# Patient Record
Sex: Female | Born: 1960 | Race: White | Hispanic: No | Marital: Married | Smoking: Current every day smoker
Health system: Southern US, Community
[De-identification: ages and names within clinical notes are randomized; demographics above are authoritative.]

## PROBLEM LIST (undated history)

## (undated) ENCOUNTER — Encounter

## (undated) ENCOUNTER — Encounter: Attending: Infectious Disease | Primary: Infectious Disease

## (undated) ENCOUNTER — Encounter: Attending: Psychiatry | Primary: Psychiatry

## (undated) ENCOUNTER — Ambulatory Visit: Payer: PRIVATE HEALTH INSURANCE

## (undated) ENCOUNTER — Ambulatory Visit: Payer: BLUE CROSS/BLUE SHIELD | Attending: Psychiatry | Primary: Psychiatry

## (undated) ENCOUNTER — Ambulatory Visit: Payer: PRIVATE HEALTH INSURANCE | Attending: Infectious Disease | Primary: Infectious Disease

## (undated) ENCOUNTER — Ambulatory Visit

## (undated) ENCOUNTER — Encounter: Attending: Family | Primary: Family

## (undated) ENCOUNTER — Telehealth

## (undated) ENCOUNTER — Ambulatory Visit: Payer: BLUE CROSS/BLUE SHIELD | Attending: Infectious Disease | Primary: Infectious Disease

## (undated) ENCOUNTER — Other Ambulatory Visit

## (undated) ENCOUNTER — Inpatient Hospital Stay

## (undated) ENCOUNTER — Ambulatory Visit: Payer: BLUE CROSS/BLUE SHIELD

## (undated) ENCOUNTER — Ambulatory Visit: Payer: PRIVATE HEALTH INSURANCE | Attending: Psychiatry | Primary: Psychiatry

## (undated) ENCOUNTER — Encounter: Payer: PRIVATE HEALTH INSURANCE | Attending: Infectious Disease | Primary: Infectious Disease

## (undated) ENCOUNTER — Encounter: Payer: BLUE CROSS/BLUE SHIELD | Attending: Psychiatry | Primary: Psychiatry

## (undated) ENCOUNTER — Telehealth: Attending: Infectious Disease | Primary: Infectious Disease

## (undated) ENCOUNTER — Encounter: Payer: 59 | Attending: Infectious Disease | Primary: Infectious Disease

## (undated) ENCOUNTER — Ambulatory Visit
Payer: PRIVATE HEALTH INSURANCE | Attending: Student in an Organized Health Care Education/Training Program | Primary: Student in an Organized Health Care Education/Training Program

## (undated) ENCOUNTER — Encounter: Attending: Clinical | Primary: Clinical

## (undated) ENCOUNTER — Encounter: Attending: Orthopaedic Surgery | Primary: Orthopaedic Surgery

## (undated) ENCOUNTER — Ambulatory Visit
Payer: Medicaid (Managed Care) | Attending: Student in an Organized Health Care Education/Training Program | Primary: Student in an Organized Health Care Education/Training Program

## (undated) ENCOUNTER — Encounter: Attending: Medical | Primary: Medical

## (undated) ENCOUNTER — Ambulatory Visit: Attending: Infectious Disease | Primary: Infectious Disease

## (undated) ENCOUNTER — Encounter
Attending: Student in an Organized Health Care Education/Training Program | Primary: Student in an Organized Health Care Education/Training Program

## (undated) DIAGNOSIS — E7800 Pure hypercholesterolemia, unspecified: Secondary | ICD-10-CM

## (undated) DIAGNOSIS — I10 Essential (primary) hypertension: Secondary | ICD-10-CM

## (undated) HISTORY — PX: TOTAL SHOULDER ARTHROPLASTY: SHX126

## (undated) HISTORY — PX: ABDOMINAL HYSTERECTOMY: SHX81

## (undated) HISTORY — PX: BREAST LUMPECTOMY: SHX2

---

## 2009-07-11 ENCOUNTER — Emergency Department: Payer: Self-pay | Admitting: Emergency Medicine

## 2010-08-06 IMAGING — CR DG CHEST 2V
1 series · 2 of 2 positions shown · non-contrast
Comparison: none

REASON FOR EXAM: chest pain
COMMENTS:

[Series 1: view not recorded · 0.17mm/px · 2 of 2 slices shown]
[im 1/2]
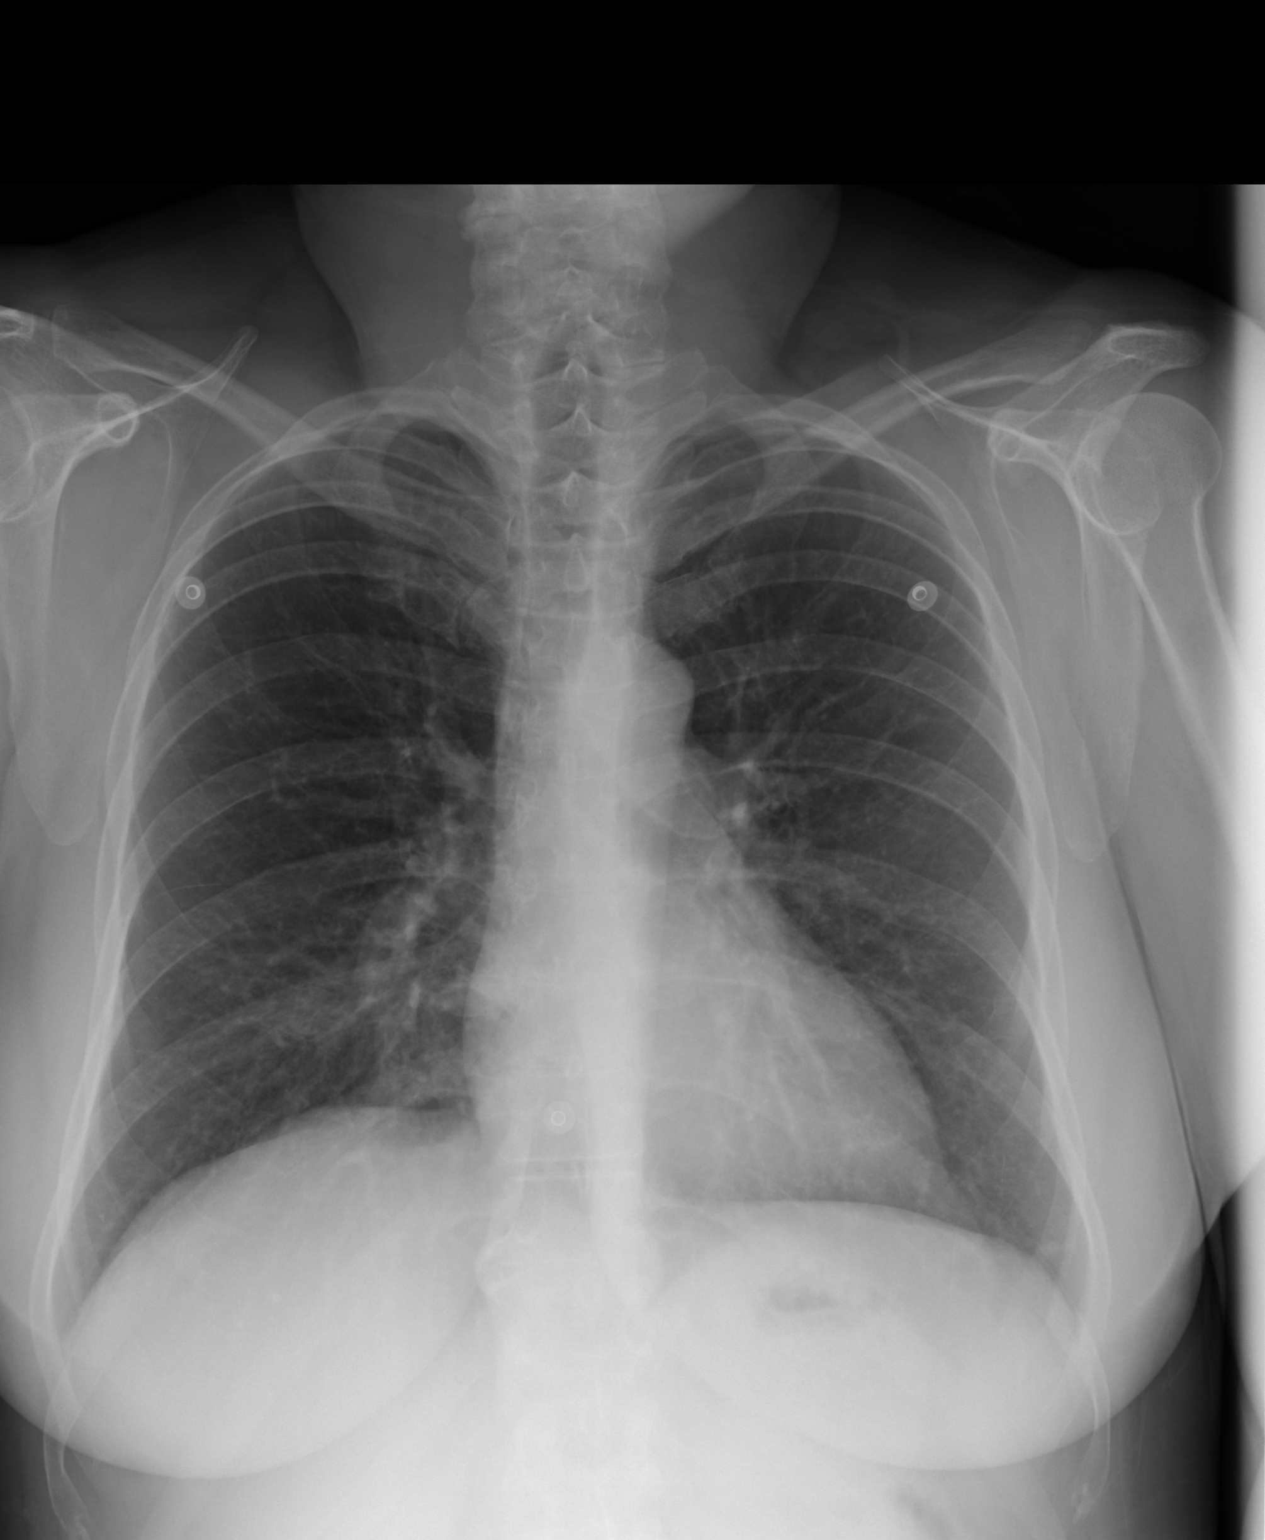
[im 2/2]
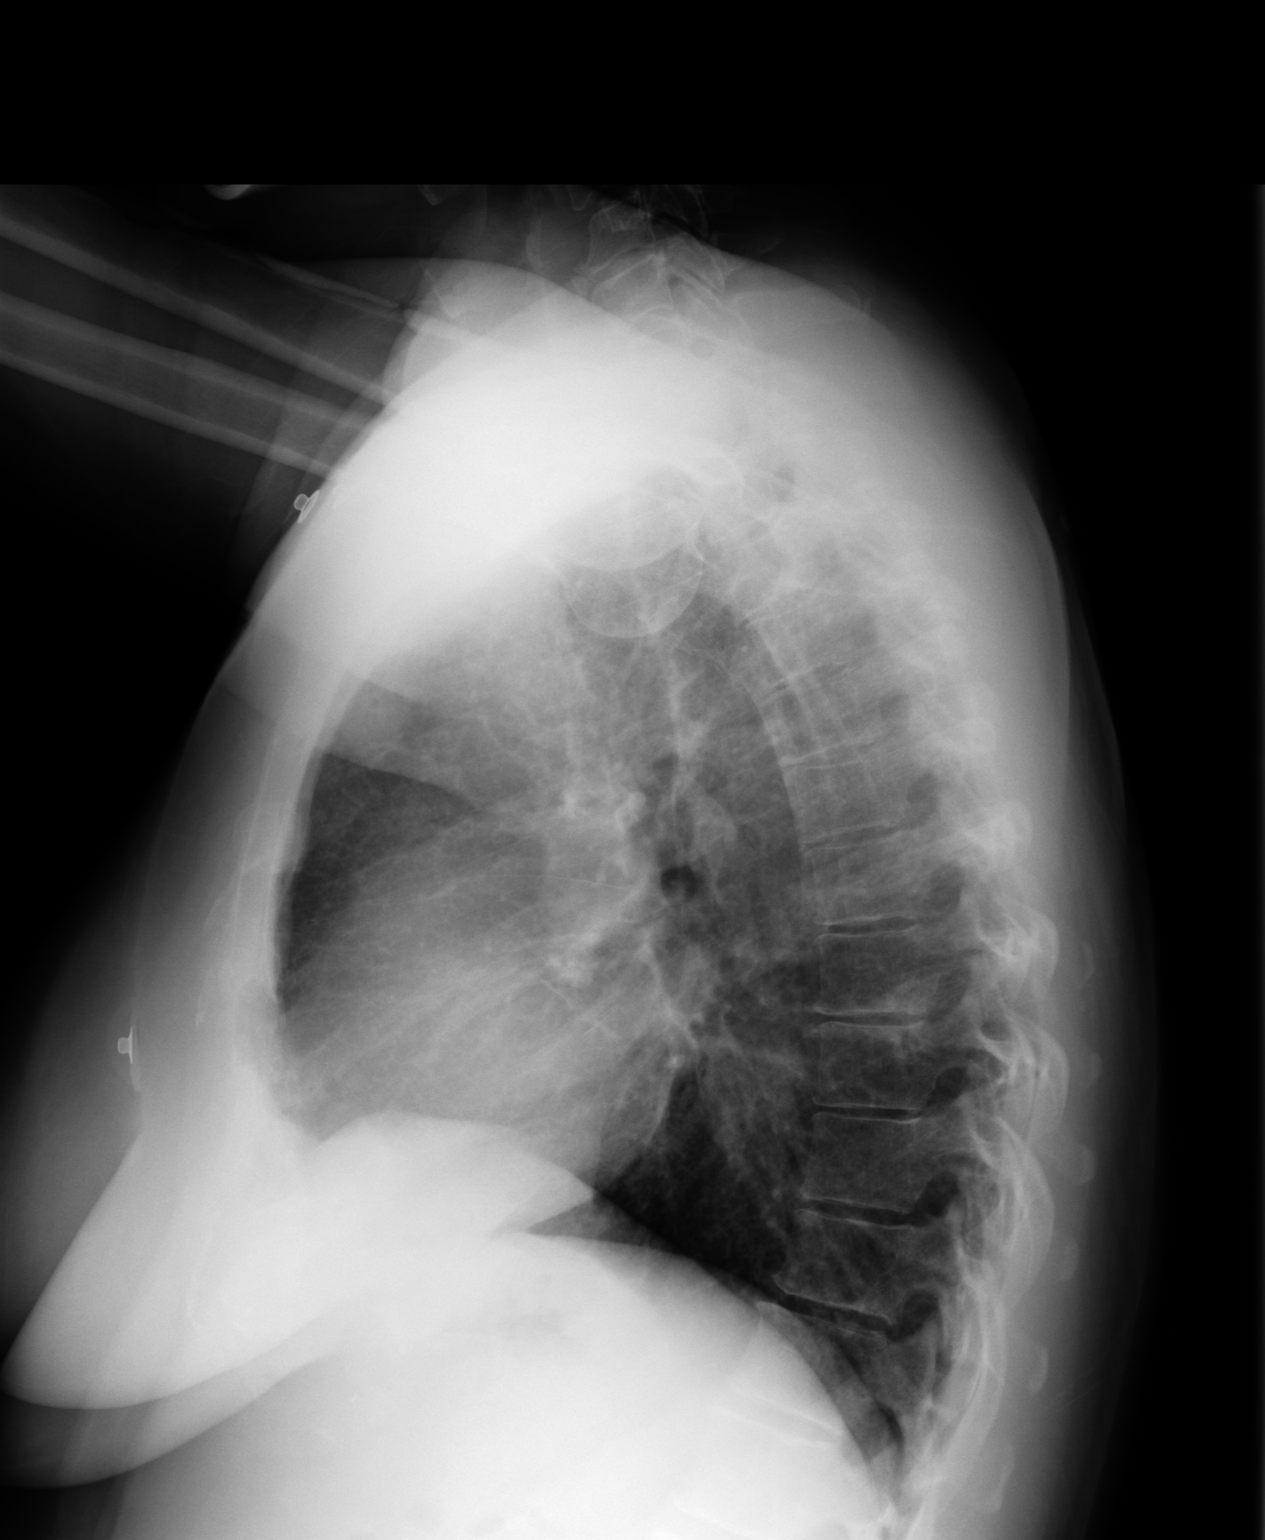

[2 of 2 positions shown; findings below may reference images not displayed]

PROCEDURE:     DXR - DXR CHEST PA (OR AP) AND LATERAL  - July 11, 2009 [DATE]

RESULT:     PA and lateral views of the chest show the lung fields to be
clear. No pneumonia, pneumothorax or pleural effusion is seen. Heart size is
normal. The mediastinal and osseous structures reveal no significant
abnormalities.
IMPRESSION: 1.     No acute changes are identified.

## 2017-06-02 MED FILL — TIVICAY/50MG/TAB: TIVICAY/50MG/TAB | 30 days supply | Qty: 30 | Fill #5

## 2017-06-02 MED FILL — DESCOVY/200MG/25MG/TABS: DESCOVY/200MG/25MG/TABS | 30 days supply | Qty: 30 | Fill #5

## 2017-06-06 ENCOUNTER — Ambulatory Visit
Admission: RE | Admit: 2017-06-06 | Discharge: 2017-06-06 | Disposition: A | Discharge status: Home / Self Care | Payer: Commercial Managed Care - PPO | Attending: Infectious Disease | Admitting: Infectious Disease

## 2017-06-06 DIAGNOSIS — B20 Human immunodeficiency virus [HIV] disease: Principal | ICD-10-CM

## 2017-06-06 MED ORDER — EMTRICITABINE 200 MG-RILPIVIRINE 25 MG-TENOFOVIR ALAFENAM 25 MG TABLET
ORAL_TABLET | Freq: Every day | ORAL | 11 refills | 0 days | Status: CP
Start: 2017-06-06 — End: 2018-07-03

## 2017-06-06 MED ORDER — ENALAPRIL MALEATE 20 MG TABLET
ORAL_TABLET | Freq: Every day | ORAL | 3 refills | 0 days | Status: CP
Start: 2017-06-06 — End: 2018-02-09

## 2017-06-06 MED ORDER — ALPRAZOLAM 0.5 MG TABLET
ORAL_TABLET | Freq: Every evening | ORAL | 3 refills | 0.00000 days | Status: CP | PRN
Start: 2017-06-06 — End: 2018-01-24

## 2017-06-07 LAB — LYMPH MARKER LIMITED,FLOW
ABSOLUTE CD3 CNT: 2557 {cells}/uL (ref 915–3400)
ABSOLUTE CD4 CNT: 1674 {cells}/uL (ref 510–2320)
ABSOLUTE CD8 CNT: 883 {cells}/uL (ref 180–1520)
CD3% (T CELLS)": 84 % (ref 61–86)
CD8% T SUPPRESR": 29 % (ref 12–38)

## 2017-06-07 LAB — HIV RNA: HIV 1 RNA:NCnc:Pt:Ser/Plas:Qn:Probe.amp.tar: 0

## 2017-06-07 LAB — HIV RNA, QUANTITATIVE, PCR: HIV RNA QNT RSLT: NOT DETECTED

## 2017-06-07 LAB — CD4% (T HELPER)": Cells.CD3+CD4+/100 cells:NFr:Pt:Bld:Qn:: 55

## 2017-06-14 MED FILL — ODEFSEY/200-25-25MG/TABS: ODEFSEY/200-25-25MG/TABS | 30 days supply | Qty: 30 | Fill #0

## 2017-06-14 NOTE — Unmapped (Signed)
[facility]   Patient Onboarding/Medication Counseling    Ms.Meder is a 56 y.o. female with HIV who I am counseling today on initiation of therapy.    Medication: Charlett Lango    Verified patient's date of birth / HIPAA.      Education Provided: ??    Dose/Administration discussed: Yes. This medication should be taken  with food.     Storage requirements: this medicine should be stored at room temperature.     Side effects discussed: Discussed common side effects, including headache, sleepiness, abnormal dreams, belly pain, nausea, upset stomach, dizziness,  If patient experiences signs of lactic acidosis (see LexiComp), low mood/depression, irregular/rapid heart rate, signs of kidney problems (see Lexicomp), they need to call the doctor.  Patient will receive a Lexi-Comp drug information handout with shipment.    Handling precautions reviewed:  n/a.    Drug Interactions: other medications reviewed and up to date in Epic.  No drug interactions identified.    Comorbidities/Allergies: reviewed and up to date in Epic.    Verified therapy is appropriate and should continue      Delivery Information    Anticipated copay of $0.00 reviewed with patient. Verified delivery address in FSI and reviewed medication storage requirement.    Scheduled delivery date: 06/16/17    Explained that we ship using UPS and this shipment will not require a signature.      Explained the services we provide at [facility] and that each month we would call to set up refills.  Stressed importance of returning phone calls so that we could ensure they receive their medications in time each month.  Informed patient that we should be setting up refills 7-10 days prior to when they will run out of medication.  Informed patient that welcome packet will be sent.      Patient verbalized understanding of the above information as well as how to contact the pharmacy at [phone] option 4 with any questions/concerns.        Patient Specific Needs      ? Patient has no physical or cognitive barriers.    ? Patient prefers to have medications discussed with  Patient     ? Patient is able to read and understand education materials at a high school level or above.        Roderic Palau  [facility] Specialty Pharmacist

## 2017-07-07 NOTE — Unmapped (Signed)
[facility] Refill and Clinical Coordination Note: HIV     HIV Medication(s): ZHYQMVH  Additional Medication(s): none    Patricia Meyer, DOB: August 14, 1961  Phone: [phone] (home) , Alternate phone contact: N/A  Shipping address: [address]   [place] [address]  Phone or address changes today?: No  All above HIPAA information verified.  Insurance changes? No    Completed refill and clinical call assessment today to schedule patient's medication shipment from the [facility] [phone]).      MEDICATION RECONCILIATION    Confirmed the medication and dosage are correct and have not changed: Yes, regimen is correct and unchanged.    Were there any changes to your medication(s) in the past month:  No, there are no changes reported at this time.    HIV-related labs:  Lab Results   Component Value Date/Time    HIVRS Not Detected 06/06/2017 12:06 PM    HIVRS Not Detected 01/05/2016 12:57 PM    HIVRS Not Detected 03/10/2015 04:43 PM    HIVRS Detected 07/01/2014 03:32 PM    HIVRS Not Detected 06/25/2013 04:48 PM    HIVRS Not Detected 09/18/2012 04:41 PM    HIVCP <40 07/01/2014 03:32 PM    HIV10 <1.60 07/01/2014 03:32 PM    HIVCM  06/06/2017 12:06 PM      Comment:      HIV-1 quantification by real-time RT-PCR is performed using the Abbott RealTime  HIV-1 test. This test is FDA approved and can quantitate HIV-1 RNA over the  range of 40 - 1,000,000 copies/mL (1.60 log(10) -6.00 log(10) copies/mL). The reference range for this assay is Not Detected.    HIVCM  01/05/2016 12:57 PM      Comment:      HIV-1 quantification by real-time RT-PCR is performed using the Abbott RealTime  HIV-1 test. This test is FDA approved and can quantitate HIV-1 RNA over the  range of 40 - 1,000,000 copies/mL (1.60 log(10) -6.00 log(10) copies/mL). The reference range for this assay is Not Detected.    HIVCM  03/10/2015 04:43 PM      Comment:      HIV-1 quantification by real-time RT-PCR is performed using the Abbott RealTime  HIV-1 test. This test is FDA approved and can quantitate HIV-1 RNA over the  range of 40 - 1,000,000 copies/mL (1.60 log(10) -6.00 log(10) copies/mL).    HIVCM : 07/01/2014 03:32 PM    HIVCM : 06/25/2013 04:48 PM    HIVCM : 09/18/2012 04:41 PM    ACD4 1,674 06/06/2017 12:06 PM    ACD4 2,250 01/05/2016 12:57 PM    ACD4  03/10/2015 04:43 PM      Comment:      2450 cells/ul    ACD4 2,404 (H) 06/25/2013 04:48 PM    ACD4 2,208 01/10/2012 03:34 PM    ACD4 1,536 09/27/2011 01:31 PM         ADHERENCE      Number of tablets of HIV Medication(s) at home: 10    Did you miss any doses in the past 4 weeks? No missed doses reported.  Adherence counseling provided? Not needed     SIDE EFFECT MANAGEMENT    Are you tolerating your medication?:  Elizbeth reports tolerating the medication.  Side effect management discussed: None      Therapy is appropriate and should be continued.        FINANCIAL/SHIPPING    Delivery Scheduled: Yes, Expected  medication delivery date: 07/12/17   Additional medications refilled: No additional medications/refills needed at this time.    [place] did not have any additional questions at this time.    Delivery address validated in FSI scheduling system: Yes, address listed above is correct.         We will follow up with patient monthly for standard refill processing and delivery.      Thank you,  Roderic Palau   [facility] Specialty Pharmacist

## 2017-07-11 MED FILL — ODEFSEY/200-25-25MG/TABS: ODEFSEY/200-25-25MG/TABS | 30 days supply | Qty: 30 | Fill #1

## 2017-07-14 NOTE — Unmapped (Signed)
PROMIS Tablet Screening  Completed Date: 06/06/2017    SW reviewed self-administered screening.  Pt scored a 2 on PHQ-9 endorsing no depression.  Denies SI. Denies SA or ETOH use. Pt scored 5 on AUDIT/AUDIT-C indicating At risk alcohol consumption. Pt reports no concerns for IPV.  Pt seen for regular ID visit today.          Arlyn Dunning, MSW, LCSW  [facility]

## 2017-08-08 NOTE — Unmapped (Signed)
[facility] Refill Coordination Note  Specialty Medication(s): ODEFSEY      Bonni Neuser, DOB: [birth year]  Phone: [phone] (home) , Alternate phone contact: N/A  Phone or address changes today?: No  All above HIPAA information was verified with patient.  Shipping Address: [address]   [place] [address]   Insurance changes? No    Completed refill call assessment today to schedule patient's medication shipment from the [facility] [phone]).      Confirmed the medication and dosage are correct and have not changed: Yes, regimen is correct and unchanged.    Confirmed patient started or stopped the following medications in the past month:  No, there are no changes reported at this time.    Are you tolerating your medication?:  Hildy reports tolerating the medication.    ADHERENCE    (Below is required for Medicare Part B or Transplant patients only - per drug):   How many tablets were dispensed last month: 30  Patient currently has ENOUGH THROUGH 08/12/17 remaining.    Did you miss any doses in the past 4 weeks? No missed doses reported.    FINANCIAL/SHIPPING    Delivery Scheduled: Yes, Expected medication delivery date: 08/11/17     Samanta did not have any additional questions at this time.    Delivery address validated in FSI scheduling system: Yes, address listed in FSI is correct.    We will follow up with patient monthly for standard refill processing and delivery.      Thank you,  Westley Gambles   [facility] Specialty Technician

## 2017-08-09 MED FILL — ODEFSEY/200-25-25MG/TABS: ODEFSEY/200-25-25MG/TABS | 30 days supply | Qty: 30 | Fill #2

## 2017-09-05 MED FILL — ODEFSEY/200-25-25MG/TABS: ODEFSEY/200-25-25MG/TABS | 30 days supply | Qty: 30 | Fill #3

## 2017-09-05 NOTE — Unmapped (Signed)
[facility] Refill Coordination Note  Specialty Medication(s): ODEFSEY      Patricia Meyer, DOB: 29-Jun-1961  Phone: [phone] (home) , Alternate phone contact: N/A  Phone or address changes today?: No  All above HIPAA information was verified with patient.  Shipping Address: [address]   [place] [address]   Insurance changes? No    Completed refill call assessment today to schedule patient's medication shipment from the [facility] [phone]).      Confirmed the medication and dosage are correct and have not changed: Yes, regimen is correct and unchanged.    Confirmed patient started or stopped the following medications in the past month:  No, there are no changes reported at this time.    Are you tolerating your medication?:  Patricia Meyer reports tolerating the medication.    ADHERENCE        Did you miss any doses in the past 4 weeks? No missed doses reported.    FINANCIAL/SHIPPING    Delivery Scheduled: Yes, Expected medication delivery date: 09/07/17     Patricia Meyer did not have any additional questions at this time.    Delivery address validated in FSI scheduling system: Yes, address listed in FSI is correct.    We will follow up with patient monthly for standard refill processing and delivery.      Thank you,  Westley Gambles   [facility] Specialty Technician

## 2017-10-03 NOTE — Unmapped (Signed)
[facility] Refill Coordination Note  Medication: ODEFSEY    Unable to reach patient to schedule shipment for medication being filled at [facility]. Left voicemail on phone.  As this is the 3rd unsuccessful attempt to reach the patient, no additional phone call attempts will be made at this time.      Phone numbers attempted: [phone]  [phone]  Last scheduled delivery: SHIPPED 09/05/17    Please call the [facility] at [phone] (option 4) should you have any further questions.      Thanks,  [facility] Specialty Team

## 2017-10-03 NOTE — Unmapped (Signed)
Called pt at number located in epic due to as of today [place] shared services has not been able to reach pt to place a refill on their medication. Rcvd pts vm. Left message w/ [place] shared services direct contact info.     Avel Peace

## 2017-10-05 NOTE — Unmapped (Signed)
[facility] Refill Coordination Note  Specialty Medication(s): ODEFSEY      Patricia Meyer, DOB: 02/15/1961  Phone: [phone] (home) , Alternate phone contact: N/A  Phone or address changes today?: No  All above HIPAA information was verified with patient.  Shipping Address: [address]   [place] [address]   Insurance changes? No    Completed refill call assessment today to schedule patient's medication shipment from the [facility] [phone]).      Confirmed the medication and dosage are correct and have not changed: Yes, regimen is correct and unchanged.    Confirmed patient started or stopped the following medications in the past month:  No, there are no changes reported at this time.    Are you tolerating your medication?:  Patricia Meyer reports tolerating the medication.    ADHERENCE    (Below is required for Medicare Part B or Transplant patients only - per drug):   How many tablets were dispensed last month: 30  Patient currently has 3 remaining.    Did you miss any doses in the past 4 weeks? No missed doses reported.    FINANCIAL/SHIPPING    Delivery Scheduled: Yes, Expected medication delivery date: 10/07/17     Patricia Meyer did not have any additional questions at this time.    Delivery address validated in FSI scheduling system: Yes, address listed in FSI is correct.    We will follow up with patient monthly for standard refill processing and delivery.      Thank you,  Westley Gambles   [facility] Specialty Technician

## 2017-10-06 MED FILL — ODEFSEY/200-25-25MG/TABS: ODEFSEY/200-25-25MG/TABS | 30 days supply | Qty: 30 | Fill #4

## 2017-10-27 NOTE — Unmapped (Signed)
[facility] Pharm[facility] Refill Coordination Note  Specialty Medication(s): ODEFSEY      Patricia Meyer, DOB: 1961/01/29  Phone: [phone] (home) , Alternate phone contact: N/A  Phone or address changes today?: No  All above HIPAA information was verified with patient.  Shipping Address: [address]   [place] [address]   Insurance changes? No    Completed refill call assessment today to schedule patient's medication shipment from the [facility] [phone]).      Confirmed the medication and dosage are correct and have not changed: Yes, regimen is correct and unchanged.    Confirmed patient started or stopped the following medications in the past month:  No, there are no changes reported at this time.    Are you tolerating your medication?:  Patricia Meyer reports tolerating the medication.    ADHERENCE        Did you miss any doses in the past 4 weeks? No missed doses reported.    FINANCIAL/SHIPPING    Delivery Scheduled: Yes, Expected medication delivery date: 11/02/17     Patricia Meyer did not have any additional questions at this time.    Delivery address validated in FSI scheduling system: Yes, address listed in FSI is correct.    We will follow up with patient monthly for standard refill processing and delivery.      Thank you,  Westley Gambles   [facility] Specialty Technician

## 2017-10-31 MED FILL — ODEFSEY/200-25-25MG/TABS: ODEFSEY/200-25-25MG/TABS | 30 days supply | Qty: 30 | Fill #5

## 2017-11-29 MED FILL — ODEFSEY/200-25-25MG/TABS: ODEFSEY/200-25-25MG/TABS | 30 days supply | Qty: 30 | Fill #6

## 2017-11-30 NOTE — Unmapped (Signed)
[facility] Pharm[facility] Refill Coordination Note  Specialty Medication(s): ODEFSEY      Vonzella Althaus, DOB: 03/25/1961  Phone: [phone] (home) , Alternate phone contact: N/A  Phone or address changes today?: No  All above HIPAA information was verified with patient.  Shipping Address: [address]   [place] [address]   Insurance changes? No    Completed refill call assessment today to schedule patient's medication shipment from the [facility] [phone]).      Confirmed the medication and dosage are correct and have not changed: Yes, regimen is correct and unchanged.    Confirmed patient started or stopped the following medications in the past month:  No, there are no changes reported at this time.    Are you tolerating your medication?:  Lasharn reports tolerating the medication.    ADHERENCE        Did you miss any doses in the past 4 weeks? No missed doses reported.    FINANCIAL/SHIPPING    Delivery Scheduled: Yes, Expected medication delivery date: 12/02/17     Karlen did not have any additional questions at this time.    Delivery address validated in FSI scheduling system: Yes, address listed in FSI is correct.    We will follow up with patient monthly for standard refill processing and delivery.      Thank you,  Westley Gambles   [facility] Shared [facility] Specialty Technician

## 2017-12-23 NOTE — Unmapped (Signed)
[facility] Pharm[facility] Refill and Clinical Coordination Note  Medication(s): Odefsey 200-25-2mg     Topaz Raglin, DOB: May 23, 1961  Phone: [phone] (home) , Alternate phone contact: N/A  Shipping address: [address]   [place] [address]  Phone or address changes today?: No  All above HIPAA information verified.  Insurance changes? No    Completed refill and clinical call assessment today to schedule patient's medication shipment from the [facility] [phone]).      MEDICATION RECONCILIATION    Confirmed the medication and dosage are correct and have not changed: Yes, regimen is correct and unchanged.    Were there any changes to your medication(s) in the past month:  Yes. Kaneisha reports starting the following medications: Gabapentin use intermittently.    ADHERENCE    Is this medicine transplant or covered by Medicare Part B? No.    Did you miss any doses in the past 4 weeks? No missed doses reported.  Adherence counseling provided? Not needed     SIDE EFFECT MANAGEMENT    Are you tolerating your medication?:  Patricia Meyer reports tolerating the medication.  Side effect management discussed: None      Therapy is appropriate and should be continued.    Evidence of clinical benefit: See Epic note from 06/06/17.      FINANCIAL/SHIPPING    Delivery Scheduled: Yes, Expected medication delivery date: 12/29/2017   Additional medications refilled: No additional medications/refills needed at this time.    Hanan did not have any additional questions at this time.    Delivery address validated in FSI scheduling system: Yes, address listed above is correct.      We will follow up with patient monthly for standard refill processing and delivery.      Thank you,  Keleigh Kazee  Anders Grant   [facility] Specialty Pharmacist

## 2017-12-27 MED FILL — ODEFSEY/200-25-25MG/TABS: ODEFSEY/200-25-25MG/TABS | 30 days supply | Qty: 30 | Fill #7

## 2018-01-25 MED ORDER — ALPRAZOLAM 0.5 MG TABLET
ORAL_TABLET | 5 refills | 0 days | Status: CP
Start: 2018-01-25 — End: 2018-08-14

## 2018-01-26 MED FILL — ODEFSEY/200-25-25MG/TABS: ODEFSEY/200-25-25MG/TABS | 30 days supply | Qty: 30 | Fill #8

## 2018-01-26 NOTE — Unmapped (Signed)
[facility] Pharm[facility] Refill Coordination Note  Specialty Medication(s): ZOXWRUE  Additional Medications shipped: none    Patricia Meyer, DOB: September 20, 1961  Phone: [phone] (home) , Alternate phone contact: N/A  Phone or address changes today?: No  All above HIPAA information was verified with patient.  Shipping Address: [address]   [place] [address]   Insurance changes? No    Completed refill call assessment today to schedule patient's medication shipment from the [facility] [phone]).      Confirmed the medication and dosage are correct and have not changed: Yes, regimen is correct and unchanged.    Confirmed patient started or stopped the following medications in the past month:  No, there are no changes reported at this time.    Are you tolerating your medication?:  Patricia Meyer reports tolerating the medication.    ADHERENCE      Did you miss any doses in the past 4 weeks? No missed doses reported.    FINANCIAL/SHIPPING    Delivery Scheduled: Yes, Expected medication delivery date: 01/27/18     The patient will receive an FSI print out for each medication shipped and additional FDA Medication Guides as required.  Patient education from [place] or Robet Leu may also be included in the shipment    Patricia Meyer did not have any additional questions at this time.    Delivery address validated in FSI scheduling system: Yes, address listed in FSI is correct.    We will follow up with patient monthly for standard refill processing and delivery.      Thank you,  Rollen Sox   [facility] Shared [facility] Specialty Pharmacist

## 2018-02-06 ENCOUNTER — Ambulatory Visit: Admit: 2018-02-06 | Discharge: 2018-02-07 | Payer: PRIVATE HEALTH INSURANCE

## 2018-02-06 DIAGNOSIS — E785 Hyperlipidemia, unspecified: Secondary | ICD-10-CM

## 2018-02-06 DIAGNOSIS — B20 Human immunodeficiency virus [HIV] disease: Principal | ICD-10-CM

## 2018-02-06 LAB — COMPREHENSIVE METABOLIC PANEL
ALBUMIN: 4.7 g/dL (ref 3.5–5.0)
ALKALINE PHOSPHATASE: 71 U/L (ref 38–126)
ALT (SGPT): 32 U/L (ref 15–48)
ANION GAP: 10 mmol/L (ref 9–15)
AST (SGOT): 32 U/L (ref 14–38)
BILIRUBIN TOTAL: 0.7 mg/dL (ref 0.0–1.2)
BLOOD UREA NITROGEN: 15 mg/dL (ref 7–21)
BUN / CREAT RATIO: 17
CALCIUM: 10 mg/dL (ref 8.5–10.2)
CHLORIDE: 102 mmol/L (ref 98–107)
CO2: 27 mmol/L (ref 22.0–30.0)
CREATININE: 0.86 mg/dL (ref 0.60–1.00)
EGFR MDRD NON AF AMER: >=60 mL/min/{1.73_m2} (ref >=60)
GLUCOSE RANDOM: 96 mg/dL (ref 65–179)
POTASSIUM: 4.2 mmol/L (ref 3.5–5.0)
PROTEIN TOTAL: 7.1 g/dL (ref 6.5–8.3)
SODIUM: 139 mmol/L (ref 135–145)

## 2018-02-06 LAB — CBC W/ AUTO DIFF
BASOPHILS ABSOLUTE COUNT: 0.1 10*9/L (ref 0.0–0.1)
BASOPHILS RELATIVE PERCENT: 0.8 %
EOSINOPHILS ABSOLUTE COUNT: 0.2 10*9/L (ref 0.0–0.4)
EOSINOPHILS RELATIVE PERCENT: 2 %
HEMATOCRIT: 50.9 % — ABNORMAL HIGH (ref 36.0–46.0)
HEMOGLOBIN: 16.9 g/dL — ABNORMAL HIGH (ref 12.0–16.0)
LYMPHOCYTES ABSOLUTE COUNT: 3.4 10*9/L (ref 1.5–5.0)
LYMPHOCYTES RELATIVE PERCENT: 40.9 %
MEAN CORPUSCULAR HEMOGLOBIN CONC: 33.1 g/dL (ref 31.0–37.0)
MEAN CORPUSCULAR HEMOGLOBIN: 34.4 pg — ABNORMAL HIGH (ref 26.0–34.0)
MEAN CORPUSCULAR VOLUME: 103.7 fL — ABNORMAL HIGH (ref 80.0–100.0)
MEAN PLATELET VOLUME: 8.3 fL (ref 7.0–10.0)
MONOCYTES ABSOLUTE COUNT: 0.3 10*9/L (ref 0.2–0.8)
MONOCYTES RELATIVE PERCENT: 3.9 %
NEUTROPHILS ABSOLUTE COUNT: 4.2 10*9/L (ref 2.0–7.5)
NEUTROPHILS RELATIVE PERCENT: 50.7 %
PLATELET COUNT: 369 10*9/L (ref 150–440)
RED BLOOD CELL COUNT: 4.9 10*12/L (ref 4.00–5.20)
RED CELL DISTRIBUTION WIDTH: 12.9 % (ref 12.0–15.0)

## 2018-02-06 LAB — LIPID PANEL
CHOLESTEROL/HDL RATIO SCREEN: 3.2 (ref <5.0)
HDL CHOLESTEROL: 75 mg/dL — ABNORMAL HIGH (ref 40–59)
LDL CHOLESTEROL CALCULATED: 134 mg/dL — ABNORMAL HIGH (ref 60–99)
TRIGLYCERIDES: 154 mg/dL — ABNORMAL HIGH (ref 1–149)
VLDL CHOLESTEROL CAL: 30.8 mg/dL (ref 11–40)

## 2018-02-06 LAB — LDL CHOLESTEROL CALCULATED: Cholesterol.in LDL:MCnc:Pt:Ser/Plas:Qn:Calculated: 134 — ABNORMAL HIGH

## 2018-02-06 LAB — LYMPH MARKER LIMITED,FLOW
ABSOLUTE CD3 CNT: 2888 {cells}/uL (ref 915–3400)
ABSOLUTE CD4 CNT: 1640 {cells}/uL (ref 510–2320)
ABSOLUTE CD8 CNT: 1212 {cells}/uL (ref 180–1520)
CD4% (T HELPER)": 46 % (ref 34–58)
CD4:CD8 RATIO: 1.4 (ref 0.9–4.8)
CD8% T SUPPRESR": 34 % (ref 12–38)

## 2018-02-06 LAB — CD3% (T CELLS)": Lab: 81

## 2018-02-06 LAB — NEUTROPHILS RELATIVE PERCENT: Lab: 50.7

## 2018-02-06 LAB — EGFR MDRD AF AMER: Glomerular filtration rate/1.73 sq M.predicted.black:ArVRat:Pt:Ser/Plas/Bld:Qn:Creatinine-based formula (MDRD): >=60

## 2018-02-07 LAB — HIV RNA, QUANTITATIVE, PCR

## 2018-02-07 LAB — HIV RNA: HIV 1 RNA:NCnc:Pt:Ser/Plas:Qn:Probe.amp.tar: 0

## 2018-02-09 MED ORDER — ENALAPRIL MALEATE 20 MG TABLET
ORAL_TABLET | 6 refills | 0 days | Status: CP
Start: 2018-02-09 — End: 2019-01-05

## 2018-02-23 NOTE — Unmapped (Signed)
[facility] Pharmacy Refill Coordination Note  Specialty Medication(s): ODEFSEY  Additional Medications shipped: NONE    Patricia Meyer, DOB: [birth year]  Phone: [phone] (home) , Alternate phone contact: N/A  Phone or address changes today?: No  All above HIPAA information was verified with patient.  Shipping Address: [address]   [place] [address]   Insurance changes? No    Completed refill call assessment today to schedule patient's medication shipment from the [facility] [phone]).      Confirmed the medication and dosage are correct and have not changed: Yes, regimen is correct and unchanged.    Confirmed patient started or stopped the following medications in the past month:  No, there are no changes reported at this time.    Are you tolerating your medication?:  Patricia Meyer reports tolerating the medication.    ADHERENCE    Did you miss any doses in the past 4 weeks? No missed doses reported.    FINANCIAL/SHIPPING    Delivery Scheduled: Yes, Expected medication delivery date: 02/28/18     The patient will receive an FSI print out for each medication shipped and additional FDA Medication Guides as required.  Patient education from [place] or Robet Leu may also be included in the shipment    Courtney did not have any additional questions at this time.    Delivery address validated in FSI scheduling system: Yes, address listed in FSI is correct.    We will follow up with patient monthly for standard refill processing and delivery.      Thank you,  Thad Ranger   [facility] Shared [facility] Specialty Pharmacist

## 2018-02-27 MED FILL — ODEFSEY/200-25-25MG/TABS: ODEFSEY/200-25-25MG/TABS | 30 days supply | Qty: 30 | Fill #9

## 2018-03-27 NOTE — Unmapped (Addendum)
5/6 further update: Patient called back with new ins info, KFR added ins to our system, it states patient must fill at [facility] specialty [phone]). Patient was given this info and she will call [facility] specialty today. I will also high priority message physician office to get new rx sent there since patient only has 4-5 days left of medication. Dis-enrolling patient from our specialty services at this time, but she is aware to call us with any problems/concerns in the future. -EF    5/6 Update: Patient said that she tried to get a rx filled at outside pharmacy and it said her coverage ended May 1st. When I ran a test claim, that is what it told me as well. She has already called customer service number on ins card and they said to speak with her employer. She is going to do that now, and will call us back by Wed with new billing info.    [facility] Special[facility] Pharmacy Refill Coordination Note  Specialty Medication(s): ODEFSY  Additional Medications shipped: NONE    Patricia Meyer, DOB: 1961/06/29  Phone: [phone] (home) , Alternate phone contact: N/A  Phone or address changes today?: No  All above HIPAA information was verified with patient.  Shipping Address: [address]   [place] [address]   Insurance changes? No    Completed refill call assessment today to schedule patient's medication shipment from the [facility] [phone]).      Confirmed the medication and dosage are correct and have not changed: Yes, regimen is correct and unchanged.    Confirmed patient started or stopped the following medications in the past month:  No, there are no changes reported at this time.    Are you tolerating your medication?:  Patricia Meyer reports tolerating the medication.    ADHERENCE    Did you miss any doses in the past 4 weeks? No missed doses reported.    FINANCIAL/SHIPPING    Delivery Scheduled: Yes, Expected medication delivery date: 03/30/18     The patient will receive an FSI print out for each medication shipped and additional FDA Medication Guides as required.  Patient education from [place] or Robet Leu may also be included in the shipment    Patricia Meyer did not have any additional questions at this time.    Delivery address validated in FSI scheduling system: Yes, address listed in FSI is correct.    We will follow up with patient monthly for standard refill processing and delivery.      Thank you,  Thad Ranger   [facility] Shared [facility] Specialty Pharmacist

## 2018-07-05 MED ORDER — EMTRICITABINE 200 MG-RILPIVIRINE 25 MG-TENOFOVIR ALAFENAM 25 MG TABLET
ORAL_TABLET | Freq: Every day | ORAL | 11 refills | 0 days | Status: CP
Start: 2018-07-05 — End: 2018-08-31

## 2018-08-16 MED ORDER — ALPRAZOLAM 0.5 MG TABLET
ORAL_TABLET | 5 refills | 0 days | Status: CP
Start: 2018-08-16 — End: 2019-02-14

## 2018-08-31 MED ORDER — EMTRICITABINE 200 MG-RILPIVIRINE 25 MG-TENOFOVIR ALAFENAM 25 MG TABLET
ORAL_TABLET | Freq: Every day | ORAL | 11 refills | 0 days | Status: CP
Start: 2018-08-31 — End: ?

## 2018-09-18 ENCOUNTER — Ambulatory Visit: Admit: 2018-09-18 | Discharge: 2018-09-19

## 2018-09-18 ENCOUNTER — Ambulatory Visit: Admit: 2018-09-18 | Discharge: 2018-09-19 | Attending: Infectious Disease | Primary: Infectious Disease

## 2018-09-18 DIAGNOSIS — B20 Human immunodeficiency virus [HIV] disease: Principal | ICD-10-CM

## 2019-01-05 MED ORDER — ENALAPRIL MALEATE 20 MG TABLET
ORAL_TABLET | 6 refills | 0 days | Status: CP
Start: 2019-01-05 — End: ?

## 2019-02-14 MED ORDER — ALPRAZOLAM 0.5 MG TABLET
ORAL_TABLET | Freq: Every evening | ORAL | 5 refills | 0 days | Status: CP | PRN
Start: 2019-02-14 — End: 2019-02-26

## 2019-02-20 ENCOUNTER — Ambulatory Visit: Admit: 2019-02-20 | Discharge: 2019-02-21 | Payer: PRIVATE HEALTH INSURANCE

## 2019-02-20 DIAGNOSIS — I10 Essential (primary) hypertension: Principal | ICD-10-CM

## 2019-02-20 DIAGNOSIS — R05 Cough: Principal | ICD-10-CM

## 2019-02-20 DIAGNOSIS — Z9289 Personal history of other medical treatment: Principal | ICD-10-CM

## 2019-02-20 DIAGNOSIS — R509 Fever, unspecified: Principal | ICD-10-CM

## 2019-02-20 DIAGNOSIS — R0989 Other specified symptoms and signs involving the circulatory and respiratory systems: Principal | ICD-10-CM

## 2019-02-20 DIAGNOSIS — R51 Headache: Principal | ICD-10-CM

## 2019-02-20 DIAGNOSIS — B20 Human immunodeficiency virus [HIV] disease: Principal | ICD-10-CM

## 2019-02-20 DIAGNOSIS — R0602 Shortness of breath: Principal | ICD-10-CM

## 2019-02-20 DIAGNOSIS — Z1383 Encounter for screening for respiratory disorder NEC: Principal | ICD-10-CM

## 2019-02-21 ENCOUNTER — Institutional Professional Consult (permissible substitution): Admit: 2019-02-21 | Discharge: 2019-02-22 | Payer: PRIVATE HEALTH INSURANCE

## 2019-02-21 DIAGNOSIS — R5383 Other fatigue: Secondary | ICD-10-CM

## 2019-02-21 DIAGNOSIS — J069 Acute upper respiratory infection, unspecified: Principal | ICD-10-CM

## 2019-02-21 DIAGNOSIS — R05 Cough: Secondary | ICD-10-CM

## 2019-02-21 DIAGNOSIS — R0602 Shortness of breath: Secondary | ICD-10-CM

## 2019-02-21 MED ORDER — CEFDINIR 300 MG CAPSULE
ORAL_CAPSULE | Freq: Two times a day (BID) | ORAL | 0 refills | 0.00000 days | Status: CP
Start: 2019-02-21 — End: 2019-03-03

## 2019-02-21 MED ORDER — BENZONATATE 200 MG CAPSULE
ORAL_CAPSULE | Freq: Three times a day (TID) | ORAL | 0 refills | 0.00000 days | Status: CP | PRN
Start: 2019-02-21 — End: 2019-03-03

## 2019-02-21 MED ORDER — METHYLPREDNISOLONE 4 MG TABLETS IN A DOSE PACK
PACK | Freq: Every day | ORAL | 0 refills | 0 days | Status: CP
Start: 2019-02-21 — End: 2020-02-21

## 2019-02-26 ENCOUNTER — Institutional Professional Consult (permissible substitution)
Admit: 2019-02-26 | Discharge: 2019-02-27 | Payer: PRIVATE HEALTH INSURANCE | Attending: Infectious Disease | Primary: Infectious Disease

## 2019-02-26 DIAGNOSIS — B20 Human immunodeficiency virus [HIV] disease: Principal | ICD-10-CM

## 2019-02-26 MED ORDER — ALPRAZOLAM 0.5 MG TABLET
ORAL_TABLET | 0 refills | 0 days | Status: CP
Start: 2019-02-26 — End: ?

## 2019-08-20 MED ORDER — ALPRAZOLAM 0.5 MG TABLET
ORAL_TABLET | 5 refills | 0 days | Status: CP
Start: 2019-08-20 — End: ?

## 2019-10-23 DIAGNOSIS — B20 Human immunodeficiency virus [HIV] disease: Principal | ICD-10-CM

## 2019-10-23 MED ORDER — EMTRICITABINE 200 MG-RILPIVIRINE 25 MG-TENOFOVIR ALAFENAM 25 MG TABLET
ORAL_TABLET | Freq: Every day | ORAL | 11 refills | 30 days | Status: CP
Start: 2019-10-23 — End: ?

## 2019-10-24 DIAGNOSIS — B20 Human immunodeficiency virus [HIV] disease: Principal | ICD-10-CM

## 2019-10-24 MED ORDER — EMTRICITABINE 200 MG-RILPIVIRINE 25 MG-TENOFOVIR ALAFENAM 25 MG TABLET
ORAL_TABLET | Freq: Every day | ORAL | 11 refills | 0 days | Status: CP
Start: 2019-10-24 — End: ?

## 2019-11-01 ENCOUNTER — Encounter: Admit: 2019-11-01 | Discharge: 2019-11-02 | Payer: PRIVATE HEALTH INSURANCE

## 2019-11-01 DIAGNOSIS — B20 Human immunodeficiency virus [HIV] disease: Principal | ICD-10-CM

## 2019-11-14 DIAGNOSIS — B20 Human immunodeficiency virus [HIV] disease: Principal | ICD-10-CM

## 2019-12-11 MED ORDER — ENALAPRIL MALEATE 20 MG TABLET
ORAL_TABLET | 6 refills | 0 days | Status: CP
Start: 2019-12-11 — End: ?

## 2020-02-22 MED ORDER — ALPRAZOLAM 0.5 MG TABLET
ORAL_TABLET | 0 refills | 0 days | Status: CP
Start: 2020-02-22 — End: ?

## 2020-04-23 MED ORDER — ALPRAZOLAM 0.5 MG TABLET
ORAL_TABLET | 0 refills | 0 days | Status: CP
Start: 2020-04-23 — End: ?

## 2020-06-09 ENCOUNTER — Ambulatory Visit: Admit: 2020-06-09 | Discharge: 2020-06-09 | Payer: PRIVATE HEALTH INSURANCE

## 2020-06-09 ENCOUNTER — Ambulatory Visit
Admit: 2020-06-09 | Discharge: 2020-06-09 | Payer: PRIVATE HEALTH INSURANCE | Attending: Infectious Disease | Primary: Infectious Disease

## 2020-06-09 DIAGNOSIS — F419 Anxiety disorder, unspecified: Principal | ICD-10-CM

## 2020-06-09 DIAGNOSIS — B20 Human immunodeficiency virus [HIV] disease: Principal | ICD-10-CM

## 2020-06-09 MED ORDER — BENAZEPRIL 20 MG-HYDROCHLOROTHIAZIDE 12.5 MG TABLET
ORAL_TABLET | Freq: Every day | ORAL | 3 refills | 90.00000 days | Status: CP
Start: 2020-06-09 — End: 2021-06-09

## 2020-06-09 MED ORDER — ATORVASTATIN 20 MG TABLET
ORAL_TABLET | Freq: Every day | ORAL | 11 refills | 30.00000 days | Status: CP
Start: 2020-06-09 — End: 2020-07-09

## 2020-06-23 MED ORDER — ALPRAZOLAM 0.5 MG TABLET
ORAL_TABLET | 0 refills | 0.00000 days
Start: 2020-06-23 — End: ?

## 2020-06-25 MED ORDER — ALPRAZOLAM 0.5 MG TABLET
ORAL_TABLET | ORAL | 0 refills | 0.00000 days | Status: CP
Start: 2020-06-25 — End: ?

## 2020-06-30 DIAGNOSIS — F419 Anxiety disorder, unspecified: Principal | ICD-10-CM

## 2020-07-07 ENCOUNTER — Ambulatory Visit: Admit: 2020-07-07 | Discharge: 2020-07-08

## 2020-09-09 MED ORDER — ALPRAZOLAM 0.5 MG TABLET
ORAL_TABLET | 0 refills | 0 days
Start: 2020-09-09 — End: ?

## 2020-09-11 ENCOUNTER — Ambulatory Visit: Admit: 2020-09-11 | Discharge: 2020-09-12 | Attending: Psychiatry | Primary: Psychiatry

## 2020-09-11 DIAGNOSIS — F411 Generalized anxiety disorder: Principal | ICD-10-CM

## 2020-09-11 DIAGNOSIS — F410 Panic disorder [episodic paroxysmal anxiety] without agoraphobia: Secondary | ICD-10-CM

## 2020-09-11 MED ORDER — ALPRAZOLAM 0.5 MG TABLET
ORAL_TABLET | ORAL | 0 refills | 0.00000 days | Status: CP
Start: 2020-09-11 — End: 2020-09-11

## 2020-09-11 MED ORDER — ALPRAZOLAM 0.5 MG TABLET: tablet | 1 refills | 0 days | Status: AC

## 2020-10-26 ENCOUNTER — Ambulatory Visit
Admit: 2020-10-26 | Discharge: 2020-10-26 | Disposition: A | Discharge status: Home / Self Care | Payer: PRIVATE HEALTH INSURANCE | Attending: Student in an Organized Health Care Education/Training Program

## 2020-10-26 DIAGNOSIS — F17210 Nicotine dependence, cigarettes, uncomplicated: Principal | ICD-10-CM

## 2020-10-26 DIAGNOSIS — I10 Essential (primary) hypertension: Principal | ICD-10-CM

## 2020-10-26 DIAGNOSIS — R439 Unspecified disturbances of smell and taste: Principal | ICD-10-CM

## 2020-10-26 DIAGNOSIS — Z79899 Other long term (current) drug therapy: Principal | ICD-10-CM

## 2020-10-26 DIAGNOSIS — R519 Headache, unspecified: Principal | ICD-10-CM

## 2020-10-26 DIAGNOSIS — N12 Tubulo-interstitial nephritis, not specified as acute or chronic: Principal | ICD-10-CM

## 2020-10-26 DIAGNOSIS — Z888 Allergy status to other drugs, medicaments and biological substances status: Principal | ICD-10-CM

## 2020-10-26 DIAGNOSIS — Z20822 Contact with and (suspected) exposure to covid-19: Principal | ICD-10-CM

## 2020-10-26 DIAGNOSIS — J029 Acute pharyngitis, unspecified: Principal | ICD-10-CM

## 2020-10-27 MED ORDER — CIPROFLOXACIN 500 MG TABLET
ORAL_TABLET | Freq: Two times a day (BID) | ORAL | 0 refills | 7 days | Status: CP
Start: 2020-10-27 — End: 2020-11-03

## 2020-12-07 MED ORDER — ENALAPRIL MALEATE 20 MG TABLET
ORAL_TABLET | 6 refills | 0 days
Start: 2020-12-07 — End: ?

## 2020-12-18 DIAGNOSIS — B20 Human immunodeficiency virus [HIV] disease: Principal | ICD-10-CM

## 2020-12-18 MED ORDER — EMTRICITABINE 200 MG-RILPIVIRINE 25 MG-TENOFOVIR ALAFENAM 25 MG TABLET
ORAL_TABLET | Freq: Every day | ORAL | 6 refills | 90 days | Status: CP
Start: 2020-12-18 — End: ?

## 2021-03-19 ENCOUNTER — Telehealth: Admit: 2021-03-19 | Discharge: 2021-03-20 | Attending: Psychiatry | Primary: Psychiatry

## 2021-03-19 DIAGNOSIS — F410 Panic disorder [episodic paroxysmal anxiety] without agoraphobia: Principal | ICD-10-CM

## 2021-03-19 DIAGNOSIS — F411 Generalized anxiety disorder: Principal | ICD-10-CM

## 2021-03-23 MED ORDER — ALPRAZOLAM 0.5 MG TABLET
ORAL_TABLET | 1 refills | 0 days | Status: CP
Start: 2021-03-23 — End: ?

## 2021-04-01 DIAGNOSIS — B20 Human immunodeficiency virus [HIV] disease: Principal | ICD-10-CM

## 2021-04-01 MED ORDER — EMTRICITABINE 200 MG-RILPIVIRINE 25 MG-TENOFOVIR ALAFENAM 25 MG TABLET
ORAL_TABLET | Freq: Every day | ORAL | 0 refills | 90.00000 days | Status: CP
Start: 2021-04-01 — End: 2021-04-01

## 2021-04-24 MED ORDER — ALPRAZOLAM 0.5 MG TABLET
ORAL_TABLET | ORAL | 0 refills | 0.00000 days | Status: CP
Start: 2021-04-24 — End: ?

## 2021-05-04 ENCOUNTER — Ambulatory Visit: Admit: 2021-05-04 | Discharge: 2021-05-04 | Attending: Infectious Disease | Primary: Infectious Disease

## 2021-05-04 DIAGNOSIS — B20 Human immunodeficiency virus [HIV] disease: Principal | ICD-10-CM

## 2021-05-04 MED ORDER — BENAZEPRIL 20 MG-HYDROCHLOROTHIAZIDE 25 MG TABLET
ORAL_TABLET | Freq: Every day | ORAL | 3 refills | 90.00000 days | Status: CP
Start: 2021-05-04 — End: 2022-05-04

## 2021-05-04 MED ORDER — ATORVASTATIN 40 MG TABLET
ORAL_TABLET | Freq: Every day | ORAL | 3 refills | 90 days | Status: CP
Start: 2021-05-04 — End: 2022-05-04

## 2021-06-02 DIAGNOSIS — U071 COVID: Principal | ICD-10-CM

## 2021-06-02 MED ORDER — NIRMATRELVIR 300 MG (150 MG X 2)-RITONAVIR 100 MG TABLET (EUA)
ORAL_TABLET | 0 refills | 5.00000 days | Status: CP
Start: 2021-06-02 — End: 2021-06-07

## 2021-06-07 DIAGNOSIS — B20 Human immunodeficiency virus [HIV] disease: Principal | ICD-10-CM

## 2021-06-07 MED ORDER — ODEFSEY 200 MG-25 MG-25 MG TABLET
ORAL_TABLET | 3 refills | 0.00000 days
Start: 2021-06-07 — End: ?

## 2021-06-12 MED ORDER — ODEFSEY 200 MG-25 MG-25 MG TABLET
ORAL_TABLET | ORAL | 3 refills | 0.00000 days | Status: CP
Start: 2021-06-12 — End: ?

## 2021-06-25 ENCOUNTER — Telehealth: Admit: 2021-06-25 | Discharge: 2021-06-26 | Attending: Psychiatry | Primary: Psychiatry

## 2021-06-25 DIAGNOSIS — F410 Panic disorder [episodic paroxysmal anxiety] without agoraphobia: Principal | ICD-10-CM

## 2021-06-25 DIAGNOSIS — F411 Generalized anxiety disorder: Principal | ICD-10-CM

## 2021-06-25 MED ORDER — ALPRAZOLAM 0.5 MG TABLET
ORAL_TABLET | 2 refills | 0 days | Status: CP
Start: 2021-06-25 — End: ?

## 2021-07-09 DIAGNOSIS — F411 Generalized anxiety disorder: Principal | ICD-10-CM

## 2021-07-09 DIAGNOSIS — F410 Panic disorder [episodic paroxysmal anxiety] without agoraphobia: Principal | ICD-10-CM

## 2021-07-09 MED ORDER — ALPRAZOLAM 0.5 MG TABLET
ORAL_TABLET | 0 refills | 0 days | Status: CP
Start: 2021-07-09 — End: ?

## 2021-08-16 ENCOUNTER — Observation Stay
Admit: 2021-08-16 | Discharge: 2021-08-16 | Disposition: A | Discharge status: Home / Self Care | Payer: BC Managed Care – PPO | Attending: Internal Medicine | Admitting: Internal Medicine

## 2021-08-16 ENCOUNTER — Observation Stay
Admission: EM | Admit: 2021-08-16 | Discharge: 2021-08-16 | Disposition: A | Discharge status: Home / Self Care | Payer: BC Managed Care – PPO | Attending: Internal Medicine | Admitting: Internal Medicine

## 2021-08-16 ENCOUNTER — Emergency Department: Payer: BC Managed Care – PPO

## 2021-08-16 ENCOUNTER — Encounter: Payer: Self-pay | Admitting: *Deleted

## 2021-08-16 ENCOUNTER — Other Ambulatory Visit: Payer: Self-pay

## 2021-08-16 DIAGNOSIS — I2489 Other forms of acute ischemic heart disease: Secondary | ICD-10-CM

## 2021-08-16 DIAGNOSIS — F419 Anxiety disorder, unspecified: Secondary | ICD-10-CM

## 2021-08-16 DIAGNOSIS — Z6379 Other stressful life events affecting family and household: Secondary | ICD-10-CM

## 2021-08-16 DIAGNOSIS — R079 Chest pain, unspecified: Secondary | ICD-10-CM | POA: Diagnosis not present

## 2021-08-16 DIAGNOSIS — I4891 Unspecified atrial fibrillation: Principal | ICD-10-CM

## 2021-08-16 DIAGNOSIS — Z96619 Presence of unspecified artificial shoulder joint: Secondary | ICD-10-CM | POA: Insufficient documentation

## 2021-08-16 DIAGNOSIS — B20 Human immunodeficiency virus [HIV] disease: Secondary | ICD-10-CM | POA: Diagnosis not present

## 2021-08-16 DIAGNOSIS — E876 Hypokalemia: Secondary | ICD-10-CM | POA: Diagnosis not present

## 2021-08-16 DIAGNOSIS — I10 Essential (primary) hypertension: Secondary | ICD-10-CM

## 2021-08-16 DIAGNOSIS — Z20822 Contact with and (suspected) exposure to covid-19: Secondary | ICD-10-CM | POA: Insufficient documentation

## 2021-08-16 DIAGNOSIS — F17210 Nicotine dependence, cigarettes, uncomplicated: Secondary | ICD-10-CM | POA: Insufficient documentation

## 2021-08-16 DIAGNOSIS — E871 Hypo-osmolality and hyponatremia: Secondary | ICD-10-CM

## 2021-08-16 DIAGNOSIS — R0602 Shortness of breath: Secondary | ICD-10-CM | POA: Insufficient documentation

## 2021-08-16 DIAGNOSIS — I248 Other forms of acute ischemic heart disease: Secondary | ICD-10-CM

## 2021-08-16 DIAGNOSIS — Z21 Asymptomatic human immunodeficiency virus [HIV] infection status: Secondary | ICD-10-CM

## 2021-08-16 DIAGNOSIS — E785 Hyperlipidemia, unspecified: Secondary | ICD-10-CM

## 2021-08-16 HISTORY — DX: Pure hypercholesterolemia, unspecified: E78.00

## 2021-08-16 HISTORY — DX: Essential (primary) hypertension: I10

## 2021-08-16 LAB — CBC
HCT: 39.4 % (ref 36.0–46.0)
Hemoglobin: 14.7 g/dL (ref 12.0–15.0)
MCH: 36.9 pg — ABNORMAL HIGH (ref 26.0–34.0)
MCHC: 37.3 g/dL — ABNORMAL HIGH (ref 30.0–36.0)
MCV: 99 fL (ref 80.0–100.0)
Platelets: 312 10*3/uL (ref 150–400)
RBC: 3.98 MIL/uL (ref 3.87–5.11)
RDW: 11.8 % (ref 11.5–15.5)
WBC: 9.9 10*3/uL (ref 4.0–10.5)
nRBC: 0 % (ref 0.0–0.2)

## 2021-08-16 LAB — BASIC METABOLIC PANEL
Anion gap: 9 (ref 5–15)
Anion gap: 10 (ref 5–15)
BUN: 14 mg/dL (ref 6–20)
BUN: 14 mg/dL (ref 6–20)
CO2: 27 mmol/L (ref 22–32)
CO2: 25 mmol/L (ref 22–32)
Calcium: 9.5 mg/dL (ref 8.9–10.3)
Calcium: 9.2 mg/dL (ref 8.9–10.3)
Chloride: 100 mmol/L (ref 98–111)
Chloride: 99 mmol/L (ref 98–111)
Creatinine, Ser: 0.85 mg/dL (ref 0.44–1.00)
Creatinine, Ser: 1.02 mg/dL — ABNORMAL HIGH (ref 0.44–1.00)
GFR, Estimated: >60 mL/min (ref >60)
GFR, Estimated: >60 mL/min (ref >60)
Glucose, Bld: 117 mg/dL — ABNORMAL HIGH (ref 70–99)
Glucose, Bld: 143 mg/dL — ABNORMAL HIGH (ref 70–99)
Potassium: 3.8 mmol/L (ref 3.5–5.1)
Potassium: 3.8 mmol/L (ref 3.5–5.1)
Sodium: 136 mmol/L (ref 135–145)
Sodium: 134 mmol/L — ABNORMAL LOW (ref 135–145)

## 2021-08-16 LAB — CBC WITH DIFFERENTIAL/PLATELET
Abs Immature Granulocytes: 0.03 10*3/uL (ref 0.00–0.07)
Basophils Absolute: 0.1 10*3/uL (ref 0.0–0.1)
Basophils Relative: 1 %
Eosinophils Absolute: 0.1 10*3/uL (ref 0.0–0.5)
Eosinophils Relative: 1 %
HCT: 44.8 % (ref 36.0–46.0)
Hemoglobin: 16.4 g/dL — ABNORMAL HIGH (ref 12.0–15.0)
Immature Granulocytes: 0 %
Lymphocytes Relative: 37 %
Lymphs Abs: 4.4 10*3/uL — ABNORMAL HIGH (ref 0.7–4.0)
MCH: 35.4 pg — ABNORMAL HIGH (ref 26.0–34.0)
MCHC: 36.6 g/dL — ABNORMAL HIGH (ref 30.0–36.0)
MCV: 96.8 fL (ref 80.0–100.0)
Monocytes Absolute: 0.7 10*3/uL (ref 0.1–1.0)
Monocytes Relative: 6 %
Neutro Abs: 6.5 10*3/uL (ref 1.7–7.7)
Neutrophils Relative %: 55 %
Platelets: 339 10*3/uL (ref 150–400)
RBC: 4.63 MIL/uL (ref 3.87–5.11)
RDW: 11.8 % (ref 11.5–15.5)
WBC: 11.8 10*3/uL — ABNORMAL HIGH (ref 4.0–10.5)
nRBC: 0 % (ref 0.0–0.2)

## 2021-08-16 LAB — COMPREHENSIVE METABOLIC PANEL
ALT: 25 U/L (ref 0–44)
AST: 34 U/L (ref 15–41)
Albumin: 4.1 g/dL (ref 3.5–5.0)
Alkaline Phosphatase: 64 U/L (ref 38–126)
Anion gap: 15 (ref 5–15)
BUN: 15 mg/dL (ref 6–20)
CO2: 24 mmol/L (ref 22–32)
Calcium: 10.7 mg/dL — ABNORMAL HIGH (ref 8.9–10.3)
Chloride: 89 mmol/L — ABNORMAL LOW (ref 98–111)
Creatinine, Ser: 1.06 mg/dL — ABNORMAL HIGH (ref 0.44–1.00)
GFR, Estimated: >60 mL/min (ref >60)
Glucose, Bld: 136 mg/dL — ABNORMAL HIGH (ref 70–99)
Potassium: 2.7 mmol/L — CL (ref 3.5–5.1)
Sodium: 128 mmol/L — ABNORMAL LOW (ref 135–145)
Total Bilirubin: 0.7 mg/dL (ref 0.3–1.2)
Total Protein: 6.7 g/dL (ref 6.5–8.1)

## 2021-08-16 LAB — ECHOCARDIOGRAM COMPLETE
AR max vel: 2.03 cm2
AV Area VTI: 2.44 cm2
AV Area mean vel: 2.38 cm2
AV Mean grad: 5 mmHg
AV Peak grad: 9.7 mmHg
Ao pk vel: 1.56 m/s
Area-P 1/2: 3.85 cm2
Calc EF: 79 %
Height: 65 in
S' Lateral: 2.6 cm
Single Plane A2C EF: 79.6 %
Single Plane A4C EF: 79.2 %
Weight: 2400 oz

## 2021-08-16 LAB — MAGNESIUM
Magnesium: 1.9 mg/dL (ref 1.7–2.4)
Magnesium: 2.1 mg/dL (ref 1.7–2.4)

## 2021-08-16 LAB — TROPONIN I (HIGH SENSITIVITY)
Troponin I (High Sensitivity): 31 ng/L — ABNORMAL HIGH (ref <18)
Troponin I (High Sensitivity): 60 ng/L — ABNORMAL HIGH (ref <18)
Troponin I (High Sensitivity): 60 ng/L — ABNORMAL HIGH (ref <18)

## 2021-08-16 LAB — HIV ANTIBODY (ROUTINE TESTING W REFLEX): HIV Screen 4th Generation wRfx: REACTIVE — AB

## 2021-08-16 LAB — T4, FREE: Free T4: 0.93 ng/dL (ref 0.61–1.12)

## 2021-08-16 LAB — BRAIN NATRIURETIC PEPTIDE: B Natriuretic Peptide: 23.8 pg/mL (ref 0.0–100.0)

## 2021-08-16 LAB — TSH: TSH: 3.42 u[IU]/mL (ref 0.350–4.500)

## 2021-08-16 LAB — RESP PANEL BY RT-PCR (FLU A&B, COVID) ARPGX2
Influenza A by PCR: NEGATIVE
Influenza B by PCR: NEGATIVE
SARS Coronavirus 2 by RT PCR: NEGATIVE

## 2021-08-16 MED ORDER — ENOXAPARIN SODIUM 40 MG/0.4ML IJ SOSY: 40.0000 mg | PREFILLED_SYRINGE | INTRAMUSCULAR | Status: DC

## 2021-08-16 MED ORDER — HYDROCHLOROTHIAZIDE 25 MG PO TABS: 25.0000 mg | ORAL_TABLET | Freq: Every day | ORAL | Status: DC

## 2021-08-16 MED ORDER — ALPRAZOLAM 0.5 MG PO TABS
0.5000 mg | ORAL_TABLET | Freq: Three times a day (TID) | ORAL | Status: DC | PRN
  Filled 2021-08-16: qty 1

## 2021-08-16 MED ORDER — SODIUM CHLORIDE 0.9 % IV SOLN: INTRAVENOUS | Status: DC

## 2021-08-16 MED ORDER — ONDANSETRON HCL 4 MG/2ML IJ SOLN: 4.0000 mg | Freq: Four times a day (QID) | INTRAMUSCULAR | Status: DC | PRN

## 2021-08-16 MED ORDER — ONDANSETRON HCL 4 MG PO TABS: 4.0000 mg | ORAL_TABLET | Freq: Four times a day (QID) | ORAL | Status: DC | PRN

## 2021-08-16 MED ORDER — METOPROLOL SUCCINATE ER 50 MG PO TB24
25.0000 mg | ORAL_TABLET | Freq: Every day | ORAL | Status: DC
  Administered 2021-08-16: 25 mg via ORAL
  Filled 2021-08-16: qty 1

## 2021-08-16 MED ORDER — EMTRICITAB-RILPIVIR-TENOFOV AF 200-25-25 MG PO TABS: 1.0000 | ORAL_TABLET | Freq: Every day | ORAL | Status: DC

## 2021-08-16 MED ORDER — BENAZEPRIL HCL 20 MG PO TABS: 20.0000 mg | ORAL_TABLET | Freq: Every day | ORAL | Status: DC

## 2021-08-16 MED ORDER — APIXABAN 5 MG PO TABS: 5.0000 mg | ORAL_TABLET | Freq: Two times a day (BID) | ORAL | 2 refills | Status: AC

## 2021-08-16 MED ORDER — POTASSIUM CHLORIDE CRYS ER 20 MEQ PO TBCR
60.0000 meq | EXTENDED_RELEASE_TABLET | Freq: Once | ORAL | Status: AC
  Administered 2021-08-16: 60 meq via ORAL
  Filled 2021-08-16: qty 3

## 2021-08-16 MED ORDER — ACETAMINOPHEN 650 MG RE SUPP: 650.0000 mg | Freq: Four times a day (QID) | RECTAL | Status: DC | PRN

## 2021-08-16 MED ORDER — ASPIRIN EC 325 MG PO TBEC
325.0000 mg | DELAYED_RELEASE_TABLET | Freq: Every day | ORAL | Status: DC
  Administered 2021-08-16: 325 mg via ORAL
  Filled 2021-08-16: qty 1

## 2021-08-16 MED ORDER — VITAMIN B-12 1000 MCG PO TABS
1000.0000 ug | ORAL_TABLET | Freq: Every day | ORAL | Status: DC
  Administered 2021-08-16: 1000 ug via ORAL
  Filled 2021-08-16: qty 1

## 2021-08-16 MED ORDER — BENAZEPRIL-HYDROCHLOROTHIAZIDE 20-25 MG PO TABS: 1.0000 | ORAL_TABLET | Freq: Every day | ORAL | Status: DC

## 2021-08-16 MED ORDER — MAGNESIUM HYDROXIDE 400 MG/5ML PO SUSP: 30.0000 mL | Freq: Every day | ORAL | Status: DC | PRN

## 2021-08-16 MED ORDER — SODIUM CHLORIDE 0.9 % IV BOLUS
250.0000 mL | Freq: Once | INTRAVENOUS | Status: AC
  Administered 2021-08-16: 250 mL via INTRAVENOUS

## 2021-08-16 MED ORDER — SODIUM CHLORIDE 0.9 % IV BOLUS
1000.0000 mL | Freq: Once | INTRAVENOUS | Status: AC
  Administered 2021-08-16: 1000 mL via INTRAVENOUS

## 2021-08-16 MED ORDER — ATORVASTATIN CALCIUM 20 MG PO TABS: 40.0000 mg | ORAL_TABLET | Freq: Every day | ORAL | Status: DC

## 2021-08-16 MED ORDER — TRAZODONE HCL 50 MG PO TABS: 25.0000 mg | ORAL_TABLET | Freq: Every evening | ORAL | Status: DC | PRN

## 2021-08-16 MED ORDER — METOPROLOL SUCCINATE ER 25 MG PO TB24: 25.0000 mg | ORAL_TABLET | Freq: Every day | ORAL | 2 refills | Status: AC

## 2021-08-16 MED ORDER — ACETAMINOPHEN 325 MG PO TABS
650.0000 mg | ORAL_TABLET | Freq: Four times a day (QID) | ORAL | Status: DC | PRN
  Administered 2021-08-16: 650 mg via ORAL
  Filled 2021-08-16: qty 2

## 2021-08-16 MED ORDER — APIXABAN 5 MG PO TABS
5.0000 mg | ORAL_TABLET | Freq: Two times a day (BID) | ORAL | Status: DC
  Administered 2021-08-16: 5 mg via ORAL
  Filled 2021-08-16: qty 1

## 2021-08-16 NOTE — Consult Note (Signed)
[facility]  CARDIOLOGY CONSULT NOTE  Patient ID: Robin Crane MRN: [id number] DOB/AGE: [birth year] 60 y.o.  Admit date: 08/16/2021 Referring Physician Noralee Stain, DO Primary Physician Midge Minium, MD Primary Cardiologist None Reason for Consultation AF, now resolved. Chest pain.   HPI:  Robin Crane is a 60 year old female with a history of HIV, hypertension and hyperlipidemia who presented to the emergency department with chest pain and was discovered to be in atrial fibrillation with RVR that is now resolved with 1 dose of IV metoprolol.  Her mother passed away on 01-Aug-2024which was unexpected after a CABG. Pior to this her daughter was on a ventilator from COVID for 3 months at [facility]. 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 She was dropping stuff off yesterday, and follow this felt very tired. She ate something and then had heart burn; she took lots of tums which then caused diarrhea. Her heart burn persisted. She then wanted to sleep so she drank some alcohol to help go to sleep. 5 minutes after laying down she felt a few skipped beats, then her heart took off racing. She took her BP which was high, and her pulse was elevated to 130-150. She felt short of breath, and chest pain. She called EMS, they gave metoprolol and it resolved. Symptoms resolved immediately. After getting to the ED she had a few short runs. Labs were notable for K 2.7, Cr 1.1, NA 128. After supplementation of these she feels better.   Notably she was recently increased her benazapril HCTZ and has not been on potassium supplementation.  Prior to her presentation she denies LE edema, orthopnea, PND. She does have reflux over the last few months after eating certain foods.   Family - Mother had MI at 18, died following CABG - Father died at age 16  Social - Smoker, < 1 ppm for 40 years.    Review of systems complete and found to be negative unless listed above     Past Medical  History:  Diagnosis Date   High cholesterol    Hypertension     Past Surgical History:  Procedure Laterality Date   ABDOMINAL HYSTERECTOMY     BREAST LUMPECTOMY     CESAREAN SECTION     TOTAL SHOULDER ARTHROPLASTY      (Not in a hospital admission)  Social History   Socioeconomic History   Marital status: Married    Spouse name: Not on file   Number of children: Not on file   Years of education: Not on file   Highest education level: Not on file  Occupational History   Not on file  Tobacco Use   Smoking status: Every Day    Types: Cigarettes   Smokeless tobacco: Not on file  Substance and Sexual Activity   Alcohol use: Yes   Drug use: Not Currently   Sexual activity: Not on file  Other Topics Concern   Not on file  Social History Narrative   Not on file   Social Determinants of Health   Financial Resource Strain: Not on file  Food Insecurity: Not on file  Transportation Needs: Not on file  Physical Activity: Not on file  Stress: Not on file  Social Connections: Not on file  Intimate Partner Violence: Not on file    No family history on file.    Review of systems complete and found to be negative unless listed above  PHYSICAL EXAM  General: Well developed, well nourished, in no acute distress HEENT:  Normocephalic and atramatic Neck:  No JVD.  Lungs: Clear bilaterally to auscultation and percussion. Heart: HRRR . Normal S1 and S2 without gallops or murmurs.  Abdomen: Bowel sounds are positive, abdomen soft and non-tender  Msk:  Back normal, normal gait. Normal strength and tone for age. Extremities: No clubbing, cyanosis or edema.   Neuro: Alert and oriented X 3. Psych:  Good affect, responds appropriately  Labs:   Lab Results  Component Value Date   WBC 9.9 08/16/2021   HGB 14.7 08/16/2021   HCT 39.4 08/16/2021   MCV 99.0 08/16/2021   PLT 312 08/16/2021    Recent Labs  Lab 08/16/21 0141 08/16/21 0604  NA 128* 136  K 2.7* 3.8  CL  89* 100  CO2 24 27  BUN 15 14  CREATININE 1.06* 0.85  CALCIUM 10.7* 9.5  PROT 6.7  --   BILITOT 0.7  --   ALKPHOS 64  --   ALT 25  --   AST 34  --   GLUCOSE 136* 117*   No results found for: CKTOTAL, CKMB, CKMBINDEX, TROPONINI No results found for: CHOL No results found for: HDL No results found for: LDLCALC No results found for: TRIG No results found for: CHOLHDL No results found for: LDLDIRECT    Radiology: DG Chest Port 1 View  Result Date: 08/16/2021 CLINICAL DATA:  Chest pain, dyspnea, tachyarrhythmia EXAM: PORTABLE CHEST 1 VIEW COMPARISON:  07/11/2009 FINDINGS: Lungs are well expanded, symmetric, and clear save for minimal linear atelectasis within the left lung base. No pneumothorax or pleural effusion. Cardiac size within normal limits. Pulmonary vascularity is normal. Osseous structures are age-appropriate. No acute bone abnormality. IMPRESSION: No active disease. Electronically Signed   By: Helyn Numbers M.D.   On: 08/16/2021 02:01    EKG: Normal sinus rhythm without evidence of ischemia.  PACs in a pattern of bigeminy.  ASSESSMENT AND PLAN:  Robin Crane is a 60 year old female with a history of HIV, hypertension and hyperlipidemia who presented to the emergency department with chest pain and was discovered to be in atrial fibrillation with RVR that is now resolved with 1 dose of IV metoprolol.  # New onset atrial fibrillation Presented with symptomatic atrial fibrillation with RVR with shortness of breath and chest tightness.  After administration of IV metoprolol this resolved and is now in sinus rhythm.  The cause of this is somewhat unclear, but on presentation her potassium was 2.7 and her creatinine was elevated consistent with dehydration and poor p.o. intake.  Additionally, I have been unable to see the EMS strips which reported A. fib with RVR, and I wonder if this was truly an SVT.  After a long discussion with the patient, we will presume this was atrial fibrillation  and will initiate Eliquis for stroke risk reduction.  We will need to follow her electrolytes closely, and as an outpatient determine if there is any atrial fibrillation recurrence.  It is probably reasonable to stop the Eliquis as an outpatient if she has no documented recurrence. - Repeat electrolytes this afternoon - CHADS2-vasc = 2 - after discussion with patient, will start Eliquis 5 mg bid.  Mutual decision making was completed. - Start metoprolol XL 25 mg daily - echocardiogram pending. Need to view prior to DC - Follow up with me Friday; she will need to call the clinic to schedule follow up. Wll have patient wear a monitor at  that time. -Encouraged patient to cut back on alcohol intake. -Patient does snore at night.  Would benefit from sleep study. -Needs to cut back on caffeine intake, stop smoking, and try to decrease ongoing stressors in her life.  Signed: Armando Reichert MD 08/16/2021, 8:24 AM

## 2021-08-16 NOTE — Discharge Summary (Signed)
Physician Discharge Summary  Eduardo Wurth QAS:[id number] DOB: September 26, 1961 DOA: 08/16/2021  PCP: Midge Minium, MD  Admit date: 08/16/2021 Discharge date: 08/16/2021  Admitted From: Home Disposition:  Home  Recommendations for Outpatient Follow-up:  Follow up with PCP in 1 week Follow up with Cardiology next week as scheduled  Discharge Condition: Stable CODE STATUS: Full  Diet recommendation: Heart healthy   Brief/Interim Summary: From H&P by Dr. Arville Care: "Robin Crane is a 60 y.o. female with medical history significant for hypertension and dyslipidemia, who presented to the emergency room with acute onset of chest pain that started earlier this evening and for which she took Xanax and Tums without relief.  She then took a vodka drink without relief.  She then had palpitations after which she called EMS.  She endorsed dyspnea without cough or wheezing or hemoptysis.  No leg pain or edema or recent travels or surgeries.  She took 4 baby aspirin given by EMS.  They reported new onset atrial fibrillation with heart rate of 1 80-200.  She was given 5 mg of IV Lopressor before coming to the hospital.  She denied any nausea or vomiting or abdominal pain.  No bleeding diathesis.  No headache or dizziness or blurred vision, paresthesias or focal muscle weakness.  No dysuria, oliguria, urinary frequency or urgency or flank pain.  ED course: Upon presentation to the ER blood pressure was 99/51 with a heart rate of 84 and proximal currently was 91 and later 94% on room air.  Labs revealed hyponatremia 128, hypochloremia of 89, hypokalemia of 2.7 and a calcium of 10.7 with magnesium 1.9.  BNP was 23.8 and high-sensitivity troponin was 31.  CBC showed leukocytosis 11.8 and hemoconcentration.  TSH was 3.4 and free T4 was 0.93.  Influenza antigens and COVID-19 PCR came back negative."  Subjective on day of discharge: Patient remained in normal sinus rhythm during hospitalization.  Potassium was replaced and cardiology  was consulted.  Echocardiogram was unremarkable.  Repeat labs on day of discharge remained stable.  On morning of discharge, patient was feeling back to her baseline without any new symptoms of chest pain or shortness of breath.  She will follow-up with cardiology as outpatient.  She was started on Eliquis as well as Toprol prior to discharge.  Discharge Diagnoses:  Principal Problem:   Atrial fibrillation with RVR (HCC) Active Problems:   Chest pain   Hyponatremia   Hypokalemia   Essential hypertension   Anxiety   Stress due to illness of family member   Hyperlipidemia   HIV (human immunodeficiency virus infection) (HCC)   Demand ischemia [facility])    Discharge Instructions  Discharge Instructions     Call MD for:  difficulty breathing, headache or visual disturbances   Complete by: As directed    Call MD for:  extreme fatigue   Complete by: As directed    Call MD for:  persistant dizziness or light-headedness   Complete by: As directed    Call MD for:  persistant nausea and vomiting   Complete by: As directed    Call MD for:  severe uncontrolled pain   Complete by: As directed    Call MD for:  temperature >100.4   Complete by: As directed    Diet - low sodium heart healthy   Complete by: As directed    Discharge instructions   Complete by: As directed    You were cared for by a hospitalist during your hospital stay. If you have any questions  about your discharge medications or the care you received while you were in the hospital after you are discharged, you can call the unit and ask to speak with the hospitalist on call if the hospitalist that took care of you is not available. Once you are discharged, your primary care physician will handle any further medical issues. Please note that NO REFILLS for any discharge medications will be authorized once you are discharged, as it is imperative that you return to your primary care physician (or establish a relationship with a primary  care physician if you do not have one) for your aftercare needs so that they can reassess your need for medications and monitor your lab values.   Increase activity slowly   Complete by: As directed       Allergies as of 08/16/2021       Reactions   Diphenhydramine Hcl Hives, Shortness Of Breath   NOTE: Patient reports reaction might have been from dye rather than drug; declines medication         Medication List     TAKE these medications    ALPRAZolam 0.5 MG tablet Commonly known as: XANAX Take 0.5 mg by mouth 3 (three) times daily as needed for anxiety.   apixaban 5 MG Tabs tablet Commonly known as: ELIQUIS Take 1 tablet (5 mg total) by mouth 2 (two) times daily.   ascorbic acid 500 MG tablet Commonly known as: VITAMIN C Take 500 mg by mouth daily.   atorvastatin 40 MG tablet Commonly known as: LIPITOR Take 40 mg by mouth at bedtime.   benazepril-hydrochlorthiazide 20-25 MG tablet Commonly known as: LOTENSIN HCT Take 1 tablet by mouth at bedtime.   metoprolol succinate 25 MG 24 hr tablet Commonly known as: TOPROL-XL Take 1 tablet (25 mg total) by mouth daily. Take with or immediately following a meal. Start taking on: August 17, 2021   Odefsey 200-25-25 MG Tabs tablet Generic drug: emtricitabine-rilpivir-tenofovir AF Take 1 tablet by mouth at bedtime.   vitamin B-12 1000 MCG tablet Commonly known as: CYANOCOBALAMIN Take 1,000 mcg by mouth daily.        Follow-up Information     Armando Reichert, MD. Schedule an appointment as soon as possible for a visit on 08/21/2021.   Specialty: Cardiology Contact information: [address] [place] [address] [phone]                Allergies  Allergen Reactions   Diphenhydramine Hcl Hives and Shortness Of Breath    NOTE: Patient reports reaction might have been from dye rather than drug; declines medication     Consultations: Cardiology    Procedures/Studies: DG Chest Port 1  View  Result Date: 08/16/2021 CLINICAL DATA:  Chest pain, dyspnea, tachyarrhythmia EXAM: PORTABLE CHEST 1 VIEW COMPARISON:  07/11/2009 FINDINGS: Lungs are well expanded, symmetric, and clear save for minimal linear atelectasis within the left lung base. No pneumothorax or pleural effusion. Cardiac size within normal limits. Pulmonary vascularity is normal. Osseous structures are age-appropriate. No acute bone abnormality. IMPRESSION: No active disease. Electronically Signed   By: Helyn Numbers M.D.   On: 08/16/2021 02:01   ECHOCARDIOGRAM COMPLETE  Result Date: 08/16/2021    ECHOCARDIOGRAM REPORT   Patient Name:   Robin Crane  Date of Exam: 08/16/2021 Medical Rec #:  [id number]  Height:       65.0 in Accession #:    [id number] Weight:       150.0 lb Date of Birth:  07-25-1961  BSA:          1.750 m Patient Age:    60 years   BP:           108/70 mmHg Patient Gender: F          HR:           67 bpm. Exam Location:  [facility] Procedure: 2D Echo Indications:     Atrial Fibrillation  History:         Patient has no prior history of Echocardiogram examinations.                  Risk Factors:Hypertension and Increased Cholesterol.  Sonographer:     L Thornton-Maynard Referring Phys:  [id number] Asia Favata Diagnosing Phys: Sena Slate IMPRESSIONS  1. Left ventricular ejection fraction, by estimation, is 60 to 65%. The left ventricle has normal function. The left ventricle has no regional wall motion abnormalities. Left ventricular diastolic parameters were normal.  2. Right ventricular systolic function is normal. The right ventricular size is normal. There is normal pulmonary artery systolic pressure.  3. The mitral valve is normal in structure. No evidence of mitral valve regurgitation.  4. The aortic valve is normal in structure. Aortic valve regurgitation is not visualized. No aortic stenosis is present.  5. The inferior vena cava is normal in size with greater than 50% respiratory variability, suggesting right atrial  pressure of 3 mmHg. FINDINGS  Left Ventricle: Left ventricular ejection fraction, by estimation, is 60 to 65%. The left ventricle has normal function. The left ventricle has no regional wall motion abnormalities. The left ventricular internal cavity size was normal in size. There is  no left ventricular hypertrophy. Left ventricular diastolic parameters were normal. Right Ventricle: The right ventricular size is normal. No increase in right ventricular wall thickness. Right ventricular systolic function is normal. There is normal pulmonary artery systolic pressure. The tricuspid regurgitant velocity is 2.13 m/s, and  with an assumed right atrial pressure of 3 mmHg, the estimated right ventricular systolic pressure is 21.1 mmHg. Left Atrium: Left atrial size was normal in size. Right Atrium: Right atrial size was normal in size. Pericardium: There is no evidence of pericardial effusion. Mitral Valve: The mitral valve is normal in structure. No evidence of mitral valve regurgitation. Tricuspid Valve: The tricuspid valve is normal in structure. Tricuspid valve regurgitation is not demonstrated. Aortic Valve: The aortic valve is normal in structure. Aortic valve regurgitation is not visualized. No aortic stenosis is present. Aortic valve mean gradient measures 5.0 mmHg. Aortic valve peak gradient measures 9.7 mmHg. Aortic valve area, by VTI measures 2.44 cm. Pulmonic Valve: The pulmonic valve was not well visualized. Pulmonic valve regurgitation is not visualized. Aorta: The aortic root is normal in size and structure. Venous: The inferior vena cava is normal in size with greater than 50% respiratory variability, suggesting right atrial pressure of 3 mmHg. IAS/Shunts: No atrial level shunt detected by color flow Doppler.  LEFT VENTRICLE PLAX 2D LVIDd:         4.60 cm     Diastology LVIDs:         2.60 cm     LV e' medial:    6.57 cm/s LV PW:         1.00 cm     LV E/e' medial:  10.6 LV IVS:        0.70 cm     LV e'  lateral:   7.80 cm/s LVOT diam:  2.00 cm     LV E/e' lateral: 8.9 LV SV:         70 LV SV Index:   40 LVOT Area:     3.14 cm  LV Volumes (MOD) LV vol d, MOD A2C: 29.1 ml LV vol d, MOD A4C: 38.9 ml LV vol s, MOD A2C: 5.9 ml LV vol s, MOD A4C: 8.1 ml LV SV MOD A2C:     23.2 ml LV SV MOD A4C:     38.9 ml LV SV MOD BP:      26.5 ml RIGHT VENTRICLE RV S prime:     9.49 cm/s TAPSE (M-mode): 2.2 cm LEFT ATRIUM             Index LA diam:        3.90 cm 2.23 cm/m LA Vol (A2C):   42.2 ml 24.11 ml/m LA Vol (A4C):   58.0 ml 33.13 ml/m LA Biplane Vol: 51.7 ml 29.53 ml/m  AORTIC VALVE                    PULMONIC VALVE AV Area (Vmax):    2.03 cm     PV Vmax:       0.79 m/s AV Area (Vmean):   2.38 cm     PV Peak grad:  2.5 mmHg AV Area (VTI):     2.44 cm AV Vmax:           156.00 cm/s AV Vmean:          102.000 cm/s AV VTI:            0.288 m AV Peak Grad:      9.7 mmHg AV Mean Grad:      5.0 mmHg LVOT Vmax:         101.00 cm/s LVOT Vmean:        77.400 cm/s LVOT VTI:          0.224 m LVOT/AV VTI ratio: 0.78  AORTA Ao Root diam: 2.80 cm Ao Asc diam:  3.40 cm MITRAL VALVE               TRICUSPID VALVE MV Area (PHT): 3.85 cm    TR Peak grad:   18.1 mmHg MV Decel Time: 197 msec    TR Vmax:        213.00 cm/s MV E velocity: 69.80 cm/s MV A velocity: 69.80 cm/s  SHUNTS MV E/A ratio:  1.00        Systemic VTI:  0.22 m                            Systemic Diam: 2.00 cm Sena Slate Electronically signed by Sena Slate Signature Date/Time: 08/16/2021/12:47:03 PM    Final        Discharge Exam: Vitals:   08/16/21 1000 08/16/21 1200  BP: 108/73 (!) 105/93  Pulse: 84 65  Resp: 18 18  Temp: 98.5 F (36.9 C)   SpO2: 97% 95%    General: Pt is alert, awake, not in acute distress Cardiovascular: RRR, S1/S2 +, no edema Respiratory: CTA bilaterally, no wheezing, no rhonchi, no respiratory distress, no conversational dyspnea  Abdominal: Soft, NT, ND, bowel sounds + Extremities: no edema, no cyanosis Psych: Normal mood and  affect, stable judgement and insight     The results of significant diagnostics from this hospitalization (including imaging, microbiology, ancillary and laboratory) are listed below for reference.  Microbiology: Recent Results (from the past 240 hour(s))  Resp Panel by RT-PCR (Flu A&B, Covid) Nasopharyngeal Swab     Status: None   Collection Time: 08/16/21  1:41 AM   Specimen: Nasopharyngeal Swab; Nasopharyngeal(NP) swabs in vial transport medium  Result Value Ref Range Status   SARS Coronavirus 2 by RT PCR NEGATIVE NEGATIVE Final    Comment: (NOTE) SARS-CoV-2 target nucleic acids are NOT DETECTED.  The SARS-CoV-2 RNA is generally detectable in upper respiratory specimens during the acute phase of infection. The lowest concentration of SARS-CoV-2 viral copies this assay can detect is 138 copies/mL. A negative result does not preclude SARS-Cov-2 infection and should not be used as the sole basis for treatment or other patient management decisions. A negative result may occur with  improper specimen collection/handling, submission of specimen other than nasopharyngeal swab, presence of viral mutation(s) within the areas targeted by this assay, and inadequate number of viral copies(<138 copies/mL). A negative result must be combined with clinical observations, patient history, and epidemiological information. The expected result is Negative.  Fact Sheet for Patients:  BloggerCourse.com  Fact Sheet for Healthcare Providers:  SeriousBroker.it  This test is no t yet approved or cleared by the [place] FDA and  has been authorized for detection and/or diagnosis of SARS-CoV-2 by FDA under an Emergency Use Authorization (EUA). This EUA will remain  in effect (meaning this test can be used) for the duration of the COVID-19 declaration under Section 564(b)(1) of the Act, 21 U.S.C.section 360bbb-3(b)(1), unless the authorization  is terminated  or revoked sooner.       Influenza A by PCR NEGATIVE NEGATIVE Final   Influenza B by PCR NEGATIVE NEGATIVE Final    Comment: (NOTE) The Xpert Xpress SARS-CoV-2/FLU/RSV plus assay is intended as an aid in the diagnosis of influenza from Nasopharyngeal swab specimens and should not be used as a sole basis for treatment. Nasal washings and aspirates are unacceptable for Xpert Xpress SARS-CoV-2/FLU/RSV testing.  Fact Sheet for Patients: BloggerCourse.com  Fact Sheet for Healthcare Providers: SeriousBroker.it  This test is not yet approved or cleared by the [place] FDA and has been authorized for detection and/or diagnosis of SARS-CoV-2 by FDA under an Emergency Use Authorization (EUA). This EUA will remain in effect (meaning this test can be used) for the duration of the COVID-19 declaration under Section 564(b)(1) of the Act, 21 U.S.C. section 360bbb-3(b)(1), unless the authorization is terminated or revoked.  Performed at [facility], [address] [place], [place] [address]      Labs: BNP (last 3 results) Recent Labs    08/16/21 0141  BNP 23.8   Basic Metabolic Panel: Recent Labs  Lab 08/16/21 0141 08/16/21 0604 08/16/21 1351  NA 128* 136 134*  K 2.7* 3.8 3.8  CL 89* 100 99  CO2 24 27 25   GLUCOSE 136* 117* 143*  BUN 15 14 14   CREATININE 1.06* 0.85 1.02*  CALCIUM 10.7* 9.5 9.2  MG 1.9  --  2.1   Liver Function Tests: Recent Labs  Lab 08/16/21 0141  AST 34  ALT 25  ALKPHOS 64  BILITOT 0.7  PROT 6.7  ALBUMIN 4.1   No results for input(s): LIPASE, AMYLASE in the last 168 hours. No results for input(s): AMMONIA in the last 168 hours. CBC: Recent Labs  Lab 08/16/21 0141 08/16/21 0650  WBC 11.8* 9.9  NEUTROABS 6.5  --   HGB 16.4* 14.7  HCT 44.8 39.4  MCV 96.8 99.0  PLT 339  312   Cardiac Enzymes: No results for input(s): CKTOTAL, CKMB, CKMBINDEX, TROPONINI in the  last 168 hours. BNP: Invalid input(s): POCBNP CBG: No results for input(s): GLUCAP in the last 168 hours. D-Dimer No results for input(s): DDIMER in the last 72 hours. Hgb A1c No results for input(s): HGBA1C in the last 72 hours. Lipid Profile No results for input(s): CHOL, HDL, LDLCALC, TRIG, CHOLHDL, LDLDIRECT in the last 72 hours. Thyroid function studies Recent Labs    08/16/21 0141  TSH 3.420   Anemia work up No results for input(s): VITAMINB12, FOLATE, FERRITIN, TIBC, IRON, RETICCTPCT in the last 72 hours. Urinalysis No results found for: COLORURINE, APPEARANCEUR, LABSPEC, PHURINE, GLUCOSEU, HGBUR, BILIRUBINUR, KETONESUR, PROTEINUR, UROBILINOGEN, NITRITE, LEUKOCYTESUR Sepsis Labs Invalid input(s): PROCALCITONIN,  WBC,  LACTICIDVEN Microbiology Recent Results (from the past 240 hour(s))  Resp Panel by RT-PCR (Flu A&B, Covid) Nasopharyngeal Swab     Status: None   Collection Time: 08/16/21  1:41 AM   Specimen: Nasopharyngeal Swab; Nasopharyngeal(NP) swabs in vial transport medium  Result Value Ref Range Status   SARS Coronavirus 2 by RT PCR NEGATIVE NEGATIVE Final    Comment: (NOTE) SARS-CoV-2 target nucleic acids are NOT DETECTED.  The SARS-CoV-2 RNA is generally detectable in upper respiratory specimens during the acute phase of infection. The lowest concentration of SARS-CoV-2 viral copies this assay can detect is 138 copies/mL. A negative result does not preclude SARS-Cov-2 infection and should not be used as the sole basis for treatment or other patient management decisions. A negative result may occur with  improper specimen collection/handling, submission of specimen other than nasopharyngeal swab, presence of viral mutation(s) within the areas targeted by this assay, and inadequate number of viral copies(<138 copies/mL). A negative result must be combined with clinical observations, patient history, and epidemiological information. The expected result is  Negative.  Fact Sheet for Patients:  BloggerCourse.com  Fact Sheet for Healthcare Providers:  SeriousBroker.it  This test is no t yet approved or cleared by the [place] FDA and  has been authorized for detection and/or diagnosis of SARS-CoV-2 by FDA under an Emergency Use Authorization (EUA). This EUA will remain  in effect (meaning this test can be used) for the duration of the COVID-19 declaration under Section 564(b)(1) of the Act, 21 U.S.C.section 360bbb-3(b)(1), unless the authorization is terminated  or revoked sooner.       Influenza A by PCR NEGATIVE NEGATIVE Final   Influenza B by PCR NEGATIVE NEGATIVE Final    Comment: (NOTE) The Xpert Xpress SARS-CoV-2/FLU/RSV plus assay is intended as an aid in the diagnosis of influenza from Nasopharyngeal swab specimens and should not be used as a sole basis for treatment. Nasal washings and aspirates are unacceptable for Xpert Xpress SARS-CoV-2/FLU/RSV testing.  Fact Sheet for Patients: BloggerCourse.com  Fact Sheet for Healthcare Providers: SeriousBroker.it  This test is not yet approved or cleared by the [place] FDA and has been authorized for detection and/or diagnosis of SARS-CoV-2 by FDA under an Emergency Use Authorization (EUA). This EUA will remain in effect (meaning this test can be used) for the duration of the COVID-19 declaration under Section 564(b)(1) of the Act, 21 U.S.C. section 360bbb-3(b)(1), unless the authorization is terminated or revoked.  Performed at [facility], [address] [place], [place] [address]      Patient was seen and examined on the day of discharge and was found to be in stable condition. Time coordinating discharge: 30 minutes including assessment and coordination of care, as  well as examination of the patient.   SIGNED:  Noralee Stain, DO [facility] 08/16/2021, 2:36 PM

## 2021-08-16 NOTE — ED Provider Notes (Signed)
[facility] Emergency Department Provider Note   ____________________________________________   Event Date/Time   First MD Initiated Contact with Patient 08/16/21 0134     (approximate)  I have reviewed the triage vital signs and the nursing notes.   HISTORY  Chief Complaint Chest Pain    HPI Robin Crane is a 60 y.o. female brought to the ED via EMS from home with a chief complaint of chest pain and new onset atrial fibrillation.  Patient reports onset of chest pain in the evening.  Took Xanax and Tums without relief.  Made a vodka drink without relief.  Called EMS because she felt like her heart was racing and she was short of breath.  Endorses recent stress.  Took 4 baby aspirin prior to arrival.  MS reports new onset atrial fibrillation heart rate 180-200s.  Given 5 mg of IV metoprolol prior to arrival.  Denies recent fever, cough, abdominal pain, nausea, vomiting or dizziness.      Past Medical History:  Diagnosis Date   High cholesterol    Hypertension     Patient Active Problem List   Diagnosis Date Noted   Chest pain 08/16/2021     Past Surgical History:  Procedure Laterality Date   ABDOMINAL HYSTERECTOMY     BREAST LUMPECTOMY     CESAREAN SECTION     TOTAL SHOULDER ARTHROPLASTY      Prior to Admission medications   Not on File    Allergies Diphenhydramine hcl  Family history Mother with atrial fibrillation s/p MI  Social History Social History   Tobacco Use   Smoking status: Every Day    Types: Cigarettes  Substance Use Topics   Alcohol use: Yes   Drug use: Not Currently    Review of Systems  Constitutional: No fever/chills Eyes: No visual changes. ENT: No sore throat. Cardiovascular: Positive for chest pain and palpitations. Respiratory: Positive for shortness of breath. Gastrointestinal: No abdominal pain.  No nausea, no vomiting.  No diarrhea.  No constipation. Genitourinary: Negative for  dysuria. Musculoskeletal: Negative for back pain. Skin: Negative for rash. Neurological: Negative for headaches, focal weakness or numbness.   ____________________________________________   PHYSICAL EXAM:  VITAL SIGNS: ED Triage Vitals [08/16/21 0134]  Enc Vitals Group     BP      Pulse      Resp      Temp      Temp src      SpO2      Weight 150 lb (68 kg)     Height 5\' 5"  (1.651 m)     Head Circumference      Peak Flow      Pain Score      Pain Loc      Pain Edu?      Excl. in GC?     Constitutional: Alert and oriented. Well appearing and in mild acute distress. Eyes: Conjunctivae are normal. PERRL. EOMI. Head: Atraumatic. Nose: No congestion/rhinnorhea. Mouth/Throat: Mucous membranes are moist.   Neck: No stridor.  No thyromegaly. Cardiovascular: Normal rate, irregular rhythm. Grossly normal heart sounds.  Good peripheral circulation. Respiratory: Normal respiratory effort.  No retractions. Lungs CTAB. Gastrointestinal: Soft and nontender. No distention. No abdominal bruits. No CVA tenderness. Musculoskeletal: No lower extremity tenderness nor edema.  No joint effusions. Neurologic:  Normal speech and language. No gross focal neurologic deficits are appreciated. No gait instability. Skin:  Skin is warm, dry and intact. No rash noted. Psychiatric: Mood and affect  are normal. Speech and behavior are normal.  ____________________________________________   LABS (all labs ordered are listed, but only abnormal results are displayed)  Labs Reviewed  CBC WITH DIFFERENTIAL/PLATELET - Abnormal; Notable for the following components:      Result Value   WBC 11.8 (*)    Hemoglobin 16.4 (*)    MCH 35.4 (*)    MCHC 36.6 (*)    Lymphs Abs 4.4 (*)    All other components within normal limits  COMPREHENSIVE METABOLIC PANEL - Abnormal; Notable for the following components:   Sodium 128 (*)    Potassium 2.7 (*)    Chloride 89 (*)    Glucose, Bld 136 (*)    Creatinine, Ser  1.06 (*)    Calcium 10.7 (*)    All other components within normal limits  TROPONIN I (HIGH SENSITIVITY) - Abnormal; Notable for the following components:   Troponin I (High Sensitivity) 31 (*)    All other components within normal limits  TROPONIN I (HIGH SENSITIVITY) - Abnormal; Notable for the following components:   Troponin I (High Sensitivity) 60 (*)    All other components within normal limits  RESP PANEL BY RT-PCR (FLU A&B, COVID) ARPGX2  BRAIN NATRIURETIC PEPTIDE  TSH  T4, FREE  MAGNESIUM  HIV ANTIBODY (ROUTINE TESTING W REFLEX)  BASIC METABOLIC PANEL  CBC  TROPONIN I (HIGH SENSITIVITY)  TROPONIN I (HIGH SENSITIVITY)   ____________________________________________  EKG  ED ECG REPORT I, Jhase Creppel J, the attending physician, personally viewed and interpreted this ECG.   Date: 08/16/2021  EKG Time: 0137  Rate: 79  Rhythm: normal sinus rhythm  Axis: Normal  Intervals: Supraventricular bigeminy  ST&T Change: Nonspecific  ____________________________________________  RADIOLOGY I, Kien Mirsky J, personally viewed and evaluated these images (plain radiographs) as part of my medical decision making, as well as reviewing the written report by the radiologist.  ED MD interpretation: No acute cardiopulmonary process  Official radiology report(s): DG Chest Port 1 View  Result Date: 08/16/2021 CLINICAL DATA:  Chest pain, dyspnea, tachyarrhythmia EXAM: PORTABLE CHEST 1 VIEW COMPARISON:  07/11/2009 FINDINGS: Lungs are well expanded, symmetric, and clear save for minimal linear atelectasis within the left lung base. No pneumothorax or pleural effusion. Cardiac size within normal limits. Pulmonary vascularity is normal. Osseous structures are age-appropriate. No acute bone abnormality. IMPRESSION: No active disease. Electronically Signed   By: Helyn Numbers M.D.   On: 08/16/2021 02:01    ____________________________________________   PROCEDURES  Procedure(s) performed  (including Critical Care):  .1-3 Lead EKG Interpretation Performed by: Irean Hong, MD Authorized by: Irean Hong, MD     Interpretation: normal     ECG rate:  80   ECG rate assessment: normal     Rhythm: sinus rhythm     Ectopy: none     Conduction: normal   Comments:     Patient placed on cardiac monitor to evaluate for arrhythmias   ____________________________________________   INITIAL IMPRESSION / ASSESSMENT AND PLAN / ED COURSE  As part of my medical decision making, I reviewed the following data within the electronic MEDICAL RECORD NUMBER Nursing notes reviewed and incorporated, Labs reviewed, EKG interpreted, Old chart reviewed, Radiograph reviewed, Discussed with admitting physician, and Notes from prior ED visits     60 year old female presenting with chest pain, shortness of breath and new onset atrial fibrillation. Differential diagnosis includes, but is not limited to, ACS, aortic dissection, pulmonary embolism, cardiac tamponade, pneumothorax, pneumonia, pericarditis, myocarditis, GI-related causes including esophagitis/gastritis,  and musculoskeletal chest wall pain.     Will obtain cardiac panel, check thyroid function.  Closely monitor and give IV Cardizem as needed.  Clinical Course as of 08/16/21 2863  Wynelle Link Aug 16, 2021  0310 Updated patient and spouse on all test results.  Discussed case with hospitalist services for admission. [JS]    Clinical Course User Index [JS] Irean Hong, MD     ____________________________________________   FINAL CLINICAL IMPRESSION(S) / ED DIAGNOSES  Final diagnoses:  Nonspecific chest pain  New onset atrial fibrillation [facility])  Hypokalemia     ED Discharge Orders     None        Note:  This document was prepared using Dragon voice recognition software and may include unintentional dictation errors.    Irean Hong, MD 08/16/21 [phone]

## 2021-08-16 NOTE — ED Notes (Signed)
Pt having intermittent periods of tachycardia 140-150's, only lasting a brief amount of time. Provider aware.

## 2021-08-16 NOTE — H&P (Signed)
[place]   PATIENT NAME: Robin Crane    MR#:  [id number]  DATE OF BIRTH:  Jan 03, 1961  DATE OF ADMISSION:  08/16/2021  PRIMARY CARE PHYSICIAN: Midge Minium, MD   Patient is coming from: Home  REQUESTING/REFERRING PHYSICIAN: Chiquita Loth, MD  CHIEF COMPLAINT:   Chief Complaint  Patient presents with   Chest Pain    HISTORY OF PRESENT ILLNESS:  Robin Crane is a 60 y.o. female with medical history significant for hypertension and dyslipidemia, who presented to the emergency room with acute onset of chest pain that started earlier this evening and for which she took Xanax and Tums without relief.  She then took a vodka drink without relief.  She then had palpitations after which she called EMS.  She endorsed dyspnea without cough or wheezing or hemoptysis.  No leg pain or edema or recent travels or surgeries.  She took 4 baby aspirin given by EMS.  They reported new onset atrial fibrillation with heart rate of 1 80-200.  She was given 5 mg of IV Lopressor before coming to the hospital.  She denied any nausea or vomiting or abdominal pain.  No bleeding diathesis.  No headache or dizziness or blurred vision, paresthesias or focal muscle weakness.  No dysuria, oliguria, urinary frequency or urgency or flank pain.  ED course: Upon presentation to the ER blood pressure was 99/51 with a heart rate of 84 and proximal currently was 91 and later 94% on room air.  Labs revealed hyponatremia 128, hypochloremia of 89, hypokalemia of 2.7 and a calcium of 10.7 with magnesium 1.9.  BNP was 23.8 and high-sensitivity troponin was 31.  CBC showed leukocytosis 11.8 and hemoconcentration.  TSH was 3.4 and free T4 was 0.93.  Influenza antigens and COVID-19 PCR came back negative.  EKG as reviewed by me : EKG here showed normal sinus rhythm with a rate of 79 with supraventricular bigeminy. Imaging: Portable chest ray showed no acute cardiopulmonary disease.  The patient was given 60 meq of p.o. potassium  chloride as well as 1 L bolus of IV normal saline. PAST MEDICAL HISTORY:   Past Medical History:  Diagnosis Date   High cholesterol    Hypertension     PAST SURGICAL HISTORY:   Past Surgical History:  Procedure Laterality Date   ABDOMINAL HYSTERECTOMY     BREAST LUMPECTOMY     CESAREAN SECTION     TOTAL SHOULDER ARTHROPLASTY      SOCIAL HISTORY:   Social History   Tobacco Use   Smoking status: Every Day    Types: Cigarettes   Smokeless tobacco: Not on file  Substance Use Topics   Alcohol use: Yes    FAMILY HISTORY:   Positive for breast cancer in her mother as well as MI.  This is otherwise positive for hypertension.  DRUG ALLERGIES:   Allergies  Allergen Reactions   Diphenhydramine Hcl Anaphylaxis    REVIEW OF SYSTEMS:   ROS As per history of present illness. All pertinent systems were reviewed above. Constitutional, HEENT, cardiovascular, respiratory, GI, GU, musculoskeletal, neuro, psychiatric, endocrine, integumentary and hematologic systems were reviewed and are otherwise negative/unremarkable except for positive findings mentioned above in the HPI.   MEDICATIONS AT HOME:   Prior to Admission medications   Not on File      VITAL SIGNS:  Blood pressure 105/75, pulse 69, temperature 98.1 F (36.7 C), resp. rate 18, height 5\' 5"  (1.651 m), weight 68 kg, SpO2 95 %.  PHYSICAL EXAMINATION:  Physical Exam  GENERAL:  60 y.o.-year-old female patient lying in the bed with no acute distress.  EYES: Pupils equal, round, reactive to light and accommodation. No scleral icterus. Extraocular muscles intact.  HEENT: Head atraumatic, normocephalic. Oropharynx and nasopharynx clear.  NECK:  Supple, no jugular venous distention. No thyroid enlargement, no tenderness.  LUNGS: Normal breath sounds bilaterally, no wheezing, rales,rhonchi or crepitation. No use of accessory muscles of respiration.  CARDIOVASCULAR: Regular rate and rhythm, S1, S2 normal. No murmurs,  rubs, or gallops.  ABDOMEN: Soft, nondistended, nontender. Bowel sounds present. No organomegaly or mass.  EXTREMITIES: No pedal edema, cyanosis, or clubbing.  NEUROLOGIC: Cranial nerves II through XII are intact. Muscle strength 5/5 in all extremities. Sensation intact. Gait not checked.  PSYCHIATRIC: The patient is alert and oriented x 3.  Normal affect and good eye contact. SKIN: No obvious rash, lesion, or ulcer.   LABORATORY PANEL:   CBC Recent Labs  Lab 08/16/21 0141  WBC 11.8*  HGB 16.4*  HCT 44.8  PLT 339   ------------------------------------------------------------------------------------------------------------------  Chemistries  Recent Labs  Lab 08/16/21 0141  NA 128*  K 2.7*  CL 89*  CO2 24  GLUCOSE 136*  BUN 15  CREATININE 1.06*  CALCIUM 10.7*  MG 1.9  AST 34  ALT 25  ALKPHOS 64  BILITOT 0.7   ------------------------------------------------------------------------------------------------------------------  Cardiac Enzymes No results for input(s): TROPONINI in the last 168 hours. ------------------------------------------------------------------------------------------------------------------  RADIOLOGY:  DG Chest Port 1 View  Result Date: 08/16/2021 CLINICAL DATA:  Chest pain, dyspnea, tachyarrhythmia EXAM: PORTABLE CHEST 1 VIEW COMPARISON:  07/11/2009 FINDINGS: Lungs are well expanded, symmetric, and clear save for minimal linear atelectasis within the left lung base. No pneumothorax or pleural effusion. Cardiac size within normal limits. Pulmonary vascularity is normal. Osseous structures are age-appropriate. No acute bone abnormality. IMPRESSION: No active disease. Electronically Signed   By: Helyn Numbers M.D.   On: 08/16/2021 02:01      IMPRESSION AND PLAN:  Active Problems:   Chest pain  1.  Chest pain with new onset atrial fibrillation with rapid ventricular response, symptomatic with palpitations, status post chemical cardioversion with  IV Lopressor. - The patient will be admitted to the PCU observation bed. - She will be monitored for recurrence of atrial fibrillation. - We will place on enteric-coated aspirin 325 mg p.o. daily. - 2D echo and cardiology consult to be obtained. - I notified Dr. Sena Slate about the patient. - We will place him on p.o. Lopressor.  2.  Hyponatremia. - This is likely hypovolemic. - The patient will be hydrated with IV normal saline and will follow BMP.  3.  Hypokalemia. - Aggressive potassium replacement has been provided as this could be the main culprit for her arrhythmia. - Magnesium level will be optimized as well.  4.  Essential hypertension. - We will hold off benazepril HCT as we are putting her on beta-blocker therapy.  5.  Peripheral neuropathy. - We will continue Neurontin.  6.  Anxiety. - We will continue Xanax.  7.  Dyslipidemia. - We will continue statin therapy.  8.  Vitamin B12 deficiency. - We will continue vitamin B12.  DVT prophylaxis: Lovenox. Code Status: full code. Family Communication:  The plan of care was discussed in details with the patient (and family). I answered all questions. The patient agreed to proceed with the above mentioned plan. Further management will depend upon hospital course. Disposition Plan: Back to previous home environment Consults called: Cardiology.  All the records are reviewed and case discussed with ED provider.  Status is: Observation  The patient remains OBS appropriate and will d/c before 2 midnights.  Dispo: The patient is from: Home              Anticipated d/c is to: Home              Patient currently is not medically stable to d/c.   Difficult to place patient No   TOTAL TIME TAKING CARE OF THIS PATIENT: 55 minutes.    Hannah Beat M.D on 08/16/2021 at 4:10 AM  [facility]   From 7 PM-7 AM, contact night-coverage [email or web address]  CC: Primary care physician; Midge Minium, MD

## 2021-08-16 NOTE — ED Triage Notes (Signed)
Pt arrives via [facility] from home. Pt with onset of CP/SOB tonight. Took xanax and tums without relief. Felt like her heart was racing, called EMS. Took 4 baby asa PTA. On arrival by EMS, pt with new onset afib rvr, hr 180-200. IV established in the left hand. Gave 5 of metoprolol, afib rate 80-100. Pressure was 95/60, gave 250ns bolus. Cbg 153

## 2021-08-18 DIAGNOSIS — I4819 Other persistent atrial fibrillation: Principal | ICD-10-CM

## 2021-08-18 LAB — HIV-1/2 AB - DIFFERENTIATION
HIV 1 Ab: REACTIVE
HIV 2 Ab: NONREACTIVE

## 2021-08-27 ENCOUNTER — Telehealth: Admit: 2021-08-27 | Discharge: 2021-08-28 | Attending: Psychiatry | Primary: Psychiatry

## 2021-09-05 MED ORDER — ALPRAZOLAM 0.5 MG TABLET
ORAL_TABLET | 0 refills | 0 days | Status: CP
Start: 2021-09-05 — End: ?

## 2021-09-25 DIAGNOSIS — F411 Generalized anxiety disorder: Principal | ICD-10-CM

## 2021-09-25 DIAGNOSIS — F410 Panic disorder [episodic paroxysmal anxiety] without agoraphobia: Principal | ICD-10-CM

## 2021-09-25 MED ORDER — ALPRAZOLAM 0.5 MG TABLET
ORAL_TABLET | 0 refills | 0 days
Start: 2021-09-25 — End: ?

## 2021-09-29 MED ORDER — ALPRAZOLAM 0.5 MG TABLET
ORAL_TABLET | 1 refills | 0 days | Status: CP
Start: 2021-09-29 — End: ?

## 2021-10-08 ENCOUNTER — Ambulatory Visit
Admit: 2021-10-08 | Discharge: 2021-10-09 | Disposition: A | Discharge status: Home / Self Care | Payer: PRIVATE HEALTH INSURANCE | Attending: Family Medicine

## 2021-10-08 ENCOUNTER — Emergency Department
Admit: 2021-10-08 | Discharge: 2021-10-09 | Disposition: A | Discharge status: Home / Self Care | Payer: PRIVATE HEALTH INSURANCE | Attending: Family Medicine

## 2021-10-08 DIAGNOSIS — I10 Essential (primary) hypertension: Principal | ICD-10-CM

## 2021-10-08 MED ORDER — AMLODIPINE 5 MG TABLET
ORAL_TABLET | Freq: Every day | ORAL | 0 refills | 30 days | Status: CP
Start: 2021-10-08 — End: ?

## 2021-10-26 ENCOUNTER — Other Ambulatory Visit: Payer: Self-pay | Admitting: Specialist

## 2021-10-26 DIAGNOSIS — Z1231 Encounter for screening mammogram for malignant neoplasm of breast: Principal | ICD-10-CM

## 2021-10-28 ENCOUNTER — Ambulatory Visit: Admit: 2021-10-28 | Discharge: 2021-10-29 | Payer: PRIVATE HEALTH INSURANCE

## 2021-10-28 DIAGNOSIS — R928 Other abnormal and inconclusive findings on diagnostic imaging of breast: Principal | ICD-10-CM

## 2021-11-02 ENCOUNTER — Ambulatory Visit
Admit: 2021-11-02 | Discharge: 2021-11-03 | Payer: PRIVATE HEALTH INSURANCE | Attending: Infectious Disease | Primary: Infectious Disease

## 2021-11-02 DIAGNOSIS — N3000 Acute cystitis without hematuria: Principal | ICD-10-CM

## 2021-11-02 DIAGNOSIS — B20 Human immunodeficiency virus [HIV] disease: Principal | ICD-10-CM

## 2021-11-02 MED ORDER — ESCITALOPRAM 5 MG TABLET
ORAL_TABLET | Freq: Every day | ORAL | 3 refills | 90.00000 days | Status: CP
Start: 2021-11-02 — End: 2022-01-31

## 2021-11-02 MED ORDER — CEPHALEXIN 500 MG CAPSULE
ORAL_CAPSULE | Freq: Two times a day (BID) | ORAL | 0 refills | 7.00000 days | Status: CP
Start: 2021-11-02 — End: ?

## 2021-11-02 MED ORDER — AMLODIPINE 10 MG TABLET
ORAL_TABLET | Freq: Every day | ORAL | 11 refills | 30 days | Status: CP
Start: 2021-11-02 — End: 2021-12-02

## 2021-11-26 ENCOUNTER — Telehealth
Admit: 2021-11-26 | Discharge: 2021-11-27 | Payer: PRIVATE HEALTH INSURANCE | Attending: Psychiatry | Primary: Psychiatry

## 2021-11-26 DIAGNOSIS — F32A Depression, unspecified depression type: Principal | ICD-10-CM

## 2021-11-26 DIAGNOSIS — F411 Generalized anxiety disorder: Principal | ICD-10-CM

## 2021-11-26 DIAGNOSIS — F410 Panic disorder [episodic paroxysmal anxiety] without agoraphobia: Principal | ICD-10-CM

## 2021-11-26 MED ORDER — ALPRAZOLAM 0.5 MG TABLET
ORAL_TABLET | ORAL | 1 refills | 0.00000 days | Status: CP
Start: 2021-11-26 — End: ?

## 2021-12-11 ENCOUNTER — Ambulatory Visit: Admit: 2021-12-11 | Discharge: 2021-12-12 | Payer: PRIVATE HEALTH INSURANCE

## 2022-01-26 ENCOUNTER — Ambulatory Visit: Admit: 2022-01-26 | Discharge: 2022-01-27 | Payer: PRIVATE HEALTH INSURANCE

## 2022-01-26 DIAGNOSIS — E785 Hyperlipidemia, unspecified: Principal | ICD-10-CM

## 2022-01-26 DIAGNOSIS — F410 Panic disorder [episodic paroxysmal anxiety] without agoraphobia: Principal | ICD-10-CM

## 2022-01-26 DIAGNOSIS — R739 Hyperglycemia, unspecified: Principal | ICD-10-CM

## 2022-01-26 DIAGNOSIS — I10 Essential (primary) hypertension: Principal | ICD-10-CM

## 2022-01-26 DIAGNOSIS — Z0000 Encounter for general adult medical examination without abnormal findings: Principal | ICD-10-CM

## 2022-01-26 DIAGNOSIS — F411 Generalized anxiety disorder: Principal | ICD-10-CM

## 2022-01-26 DIAGNOSIS — I4891 Unspecified atrial fibrillation: Principal | ICD-10-CM

## 2022-01-26 DIAGNOSIS — M25522 Pain in left elbow: Principal | ICD-10-CM

## 2022-01-26 DIAGNOSIS — F32A Depression, unspecified depression type: Principal | ICD-10-CM

## 2022-01-26 DIAGNOSIS — B20 Human immunodeficiency virus [HIV] disease: Principal | ICD-10-CM

## 2022-01-26 DIAGNOSIS — Z1329 Encounter for screening for other suspected endocrine disorder: Principal | ICD-10-CM

## 2022-01-26 DIAGNOSIS — Z131 Encounter for screening for diabetes mellitus: Principal | ICD-10-CM

## 2022-03-25 ENCOUNTER — Telehealth
Admit: 2022-03-25 | Discharge: 2022-03-26 | Payer: PRIVATE HEALTH INSURANCE | Attending: Psychiatry | Primary: Psychiatry

## 2022-03-25 DIAGNOSIS — F32A Depression, unspecified depression type: Principal | ICD-10-CM

## 2022-03-25 DIAGNOSIS — F411 Generalized anxiety disorder: Principal | ICD-10-CM

## 2022-03-25 DIAGNOSIS — F410 Panic disorder [episodic paroxysmal anxiety] without agoraphobia: Principal | ICD-10-CM

## 2022-03-25 MED ORDER — ESCITALOPRAM 10 MG TABLET
ORAL_TABLET | Freq: Every day | ORAL | 2 refills | 30 days
Start: 2022-03-25 — End: 2022-06-23

## 2022-03-30 MED ORDER — ESCITALOPRAM 10 MG TABLET
ORAL_TABLET | Freq: Every day | ORAL | 1 refills | 90 days | Status: CP
Start: 2022-03-30 — End: ?

## 2022-05-04 DIAGNOSIS — F411 Generalized anxiety disorder: Principal | ICD-10-CM

## 2022-05-04 DIAGNOSIS — F410 Panic disorder [episodic paroxysmal anxiety] without agoraphobia: Principal | ICD-10-CM

## 2022-05-04 MED ORDER — BENAZEPRIL 20 MG-HYDROCHLOROTHIAZIDE 25 MG TABLET
ORAL_TABLET | Freq: Every day | ORAL | 3 refills | 90 days
Start: 2022-05-04 — End: 2023-05-04

## 2022-05-04 MED ORDER — ALPRAZOLAM 0.5 MG TABLET
ORAL_TABLET | 0 refills | 0 days
Start: 2022-05-04 — End: ?

## 2022-05-05 MED ORDER — ALPRAZOLAM 0.5 MG TABLET
ORAL_TABLET | 0 refills | 0 days | Status: CP
Start: 2022-05-05 — End: ?

## 2022-05-05 MED ORDER — BENAZEPRIL 20 MG-HYDROCHLOROTHIAZIDE 25 MG TABLET
ORAL_TABLET | Freq: Every day | ORAL | 1 refills | 90.00000 days | Status: CP
Start: 2022-05-05 — End: 2022-05-05

## 2022-06-02 DIAGNOSIS — B20 Human immunodeficiency virus [HIV] disease: Principal | ICD-10-CM

## 2022-06-02 MED ORDER — ODEFSEY 200 MG-25 MG-25 MG TABLET
ORAL_TABLET | 3 refills | 0 days
Start: 2022-06-02 — End: ?

## 2022-06-03 MED ORDER — ODEFSEY 200 MG-25 MG-25 MG TABLET
ORAL_TABLET | 3 refills | 0 days | Status: CP
Start: 2022-06-03 — End: ?

## 2022-06-23 ENCOUNTER — Ambulatory Visit
Admit: 2022-06-23 | Discharge: 2022-06-24 | Disposition: A | Discharge status: Other Acute Inpatient Hospital | Payer: PRIVATE HEALTH INSURANCE | Attending: Medical

## 2022-06-23 ENCOUNTER — Emergency Department
Admit: 2022-06-23 | Discharge: 2022-06-24 | Disposition: A | Discharge status: Other Acute Inpatient Hospital | Payer: PRIVATE HEALTH INSURANCE | Attending: Medical

## 2022-06-23 DIAGNOSIS — K5792 Diverticulitis of intestine, part unspecified, without perforation or abscess without bleeding: Principal | ICD-10-CM

## 2022-06-24 ENCOUNTER — Ambulatory Visit
Admit: 2022-06-24 | Discharge: 2022-06-26 | Disposition: A | Discharge status: Home / Self Care | Payer: PRIVATE HEALTH INSURANCE | Source: Other Acute Inpatient Hospital | Admitting: Surgery

## 2022-06-26 MED ORDER — ESCITALOPRAM 10 MG TABLET
ORAL_TABLET | Freq: Every day | ORAL | 0 refills | 30 days | Status: CP
Start: 2022-06-26 — End: 2022-07-26

## 2022-06-26 MED ORDER — AMLODIPINE 10 MG TABLET
ORAL_TABLET | Freq: Every day | ORAL | 0 refills | 30 days | Status: CP
Start: 2022-06-26 — End: 2022-07-26

## 2022-06-26 MED ORDER — AMOXICILLIN 875 MG-POTASSIUM CLAVULANATE 125 MG TABLET
ORAL_TABLET | Freq: Two times a day (BID) | ORAL | 0 refills | 7 days | Status: CP
Start: 2022-06-26 — End: ?

## 2022-06-26 MED ORDER — ATORVASTATIN 40 MG TABLET
ORAL_TABLET | Freq: Every day | ORAL | 0 refills | 30 days | Status: CP
Start: 2022-06-26 — End: 2022-07-26

## 2022-06-26 MED ORDER — OXYCODONE 5 MG TABLET
ORAL_TABLET | ORAL | 0 refills | 1 days | Status: CP | PRN
Start: 2022-06-26 — End: 2022-07-01

## 2022-06-30 ENCOUNTER — Ambulatory Visit: Admit: 2022-06-30 | Discharge: 2022-07-01 | Payer: PRIVATE HEALTH INSURANCE | Attending: Family | Primary: Family

## 2022-06-30 DIAGNOSIS — Z09 Encounter for follow-up examination after completed treatment for conditions other than malignant neoplasm: Principal | ICD-10-CM

## 2022-06-30 DIAGNOSIS — K5792 Diverticulitis of intestine, part unspecified, without perforation or abscess without bleeding: Principal | ICD-10-CM

## 2022-06-30 MED ORDER — TRAMADOL 37.5 MG-ACETAMINOPHEN 325 MG TABLET
ORAL_TABLET | Freq: Four times a day (QID) | ORAL | 0 refills | 3 days | Status: CP | PRN
Start: 2022-06-30 — End: ?

## 2022-07-29 ENCOUNTER — Ambulatory Visit: Admit: 2022-07-29 | Discharge: 2022-07-30 | Payer: PRIVATE HEALTH INSURANCE

## 2022-07-29 DIAGNOSIS — Z09 Encounter for follow-up examination after completed treatment for conditions other than malignant neoplasm: Principal | ICD-10-CM

## 2022-07-29 DIAGNOSIS — F32A Depression, unspecified depression type: Principal | ICD-10-CM

## 2022-07-29 DIAGNOSIS — I4891 Unspecified atrial fibrillation: Principal | ICD-10-CM

## 2022-07-29 DIAGNOSIS — F411 Generalized anxiety disorder: Principal | ICD-10-CM

## 2022-07-29 DIAGNOSIS — F410 Panic disorder [episodic paroxysmal anxiety] without agoraphobia: Principal | ICD-10-CM

## 2022-07-29 DIAGNOSIS — I10 Essential (primary) hypertension: Principal | ICD-10-CM

## 2022-07-29 DIAGNOSIS — N6320 Unspecified lump in the left breast, unspecified quadrant: Principal | ICD-10-CM

## 2022-07-29 DIAGNOSIS — F17200 Nicotine dependence, unspecified, uncomplicated: Principal | ICD-10-CM

## 2022-07-29 DIAGNOSIS — B20 Human immunodeficiency virus [HIV] disease: Principal | ICD-10-CM

## 2022-07-29 MED ORDER — METOPROLOL TARTRATE 25 MG TABLET
ORAL_TABLET | Freq: Every evening | ORAL | 1 refills | 90.00000 days | Status: CP
Start: 2022-07-29 — End: ?

## 2022-07-29 MED ORDER — AMLODIPINE 5 MG TABLET
ORAL_TABLET | Freq: Every day | ORAL | 1 refills | 90 days | Status: CP
Start: 2022-07-29 — End: 2023-07-29

## 2022-07-29 MED ORDER — HYDROCHLOROTHIAZIDE 25 MG TABLET
ORAL_TABLET | Freq: Every day | ORAL | 1 refills | 90.00000 days | Status: CP
Start: 2022-07-29 — End: 2023-07-29

## 2022-07-29 MED ORDER — BENAZEPRIL 40 MG TABLET
ORAL_TABLET | Freq: Every day | ORAL | 1 refills | 90.00000 days | Status: CP
Start: 2022-07-29 — End: 2023-07-29

## 2022-07-29 MED ORDER — ESCITALOPRAM 10 MG TABLET
ORAL_TABLET | Freq: Every day | ORAL | 0 refills | 30 days | Status: CP
Start: 2022-07-29 — End: 2022-08-28

## 2022-07-29 MED ORDER — APIXABAN 5 MG TABLET
ORAL_TABLET | Freq: Two times a day (BID) | ORAL | 1 refills | 90 days | Status: CP
Start: 2022-07-29 — End: ?

## 2022-07-30 DIAGNOSIS — F410 Panic disorder [episodic paroxysmal anxiety] without agoraphobia: Principal | ICD-10-CM

## 2022-07-30 DIAGNOSIS — F411 Generalized anxiety disorder: Principal | ICD-10-CM

## 2022-07-30 DIAGNOSIS — F32A Depression, unspecified depression type: Principal | ICD-10-CM

## 2022-07-30 MED ORDER — ESCITALOPRAM 10 MG TABLET
ORAL_TABLET | 0 refills | 0 days
Start: 2022-07-30 — End: ?

## 2022-08-03 DIAGNOSIS — F411 Generalized anxiety disorder: Principal | ICD-10-CM

## 2022-08-03 DIAGNOSIS — F410 Panic disorder [episodic paroxysmal anxiety] without agoraphobia: Principal | ICD-10-CM

## 2022-08-03 MED ORDER — ALPRAZOLAM 0.5 MG TABLET
ORAL_TABLET | 0 refills | 0 days | Status: CP
Start: 2022-08-03 — End: ?

## 2022-08-04 DIAGNOSIS — N6320 Unspecified lump in the left breast, unspecified quadrant: Principal | ICD-10-CM

## 2022-08-26 ENCOUNTER — Telehealth
Admit: 2022-08-26 | Discharge: 2022-08-27 | Payer: PRIVATE HEALTH INSURANCE | Attending: Psychiatry | Primary: Psychiatry

## 2022-08-26 DIAGNOSIS — F32A Depression, unspecified depression type: Principal | ICD-10-CM

## 2022-08-26 DIAGNOSIS — G4700 Insomnia, unspecified: Principal | ICD-10-CM

## 2022-08-26 DIAGNOSIS — F411 Generalized anxiety disorder: Principal | ICD-10-CM

## 2022-08-26 DIAGNOSIS — F410 Panic disorder [episodic paroxysmal anxiety] without agoraphobia: Principal | ICD-10-CM

## 2022-08-26 MED ORDER — DOXEPIN 10 MG CAPSULE
ORAL_CAPSULE | 1 refills | 0 days | Status: CP
Start: 2022-08-26 — End: ?

## 2022-08-26 MED ORDER — ESCITALOPRAM 20 MG TABLET
ORAL_TABLET | Freq: Every day | ORAL | 3 refills | 90 days | Status: CP
Start: 2022-08-26 — End: ?

## 2022-09-09 ENCOUNTER — Ambulatory Visit: Admit: 2022-09-09 | Discharge: 2022-09-10 | Payer: PRIVATE HEALTH INSURANCE

## 2022-09-11 IMAGING — DX DG CHEST 1V PORT
2 series · 2 of 2 positions shown · non-contrast
Comparison: 07/11/2009

CLINICAL DATA: Chest pain, dyspnea, tachyarrhythmia

EXAM:
PORTABLE CHEST 1 VIEW

[chest ap (1 of 2)]
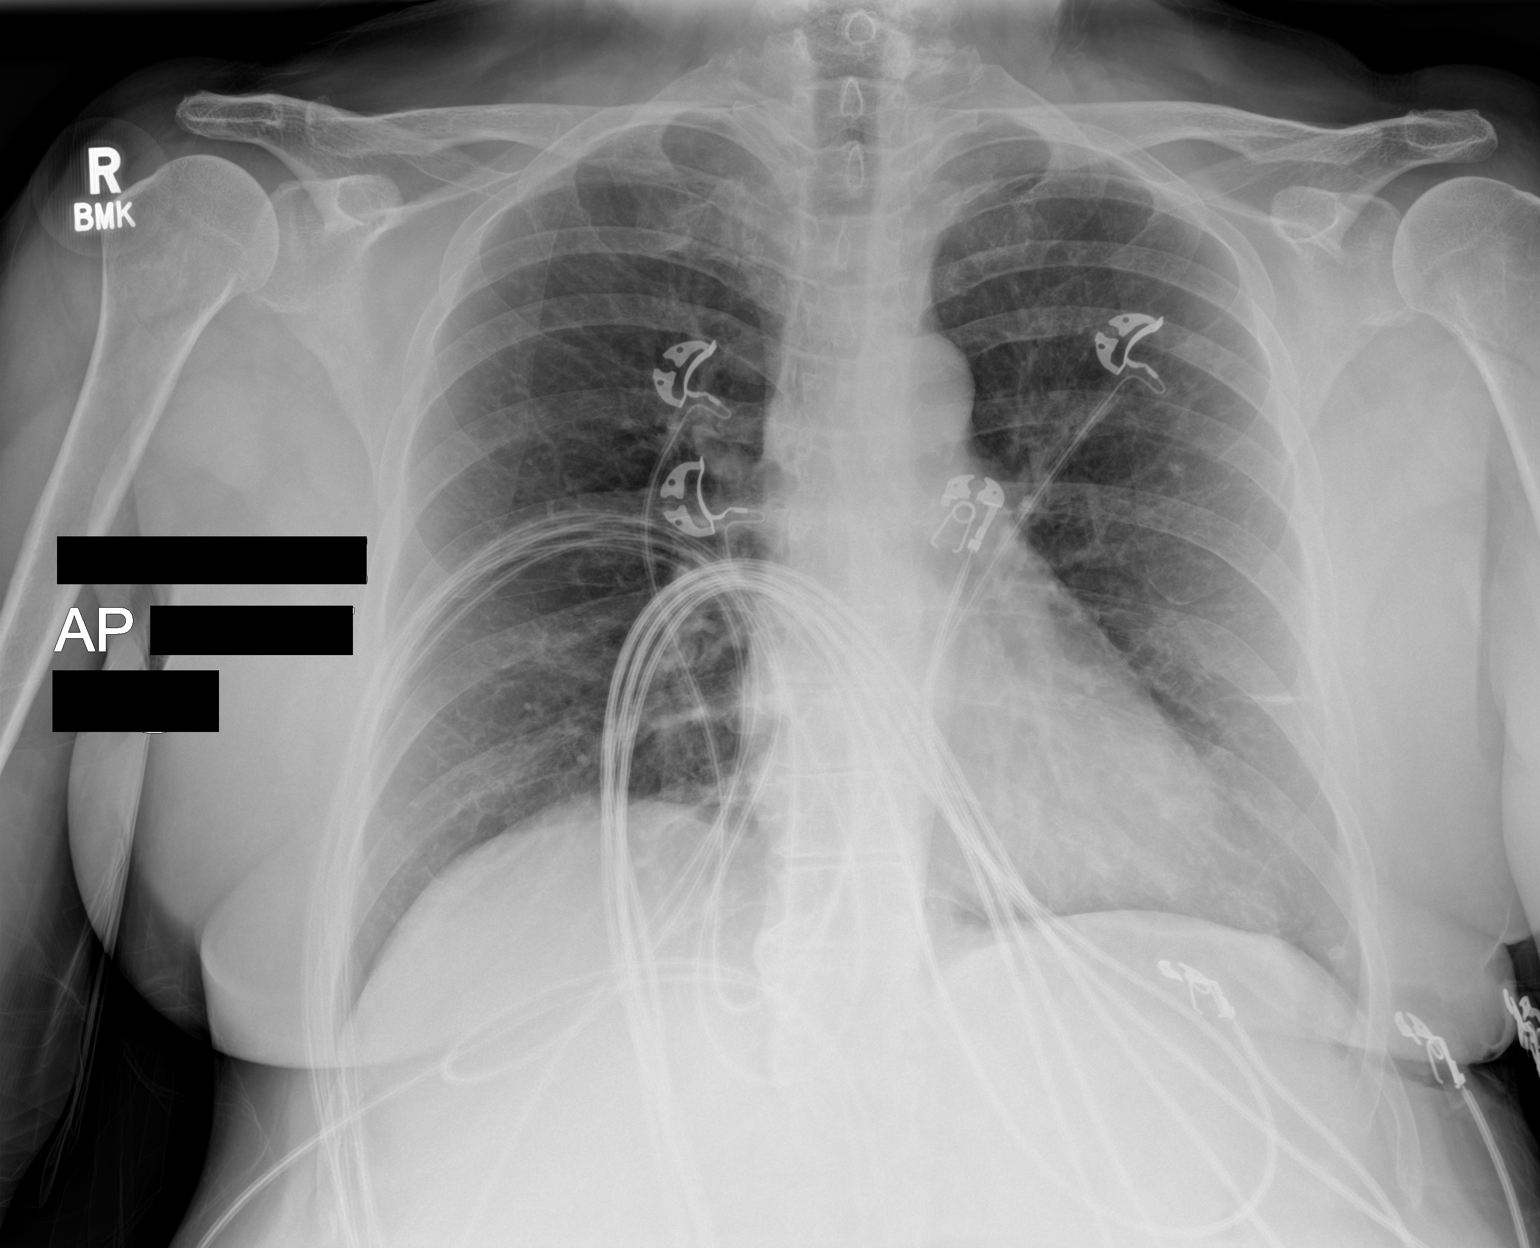

[chest ap (2 of 2)]
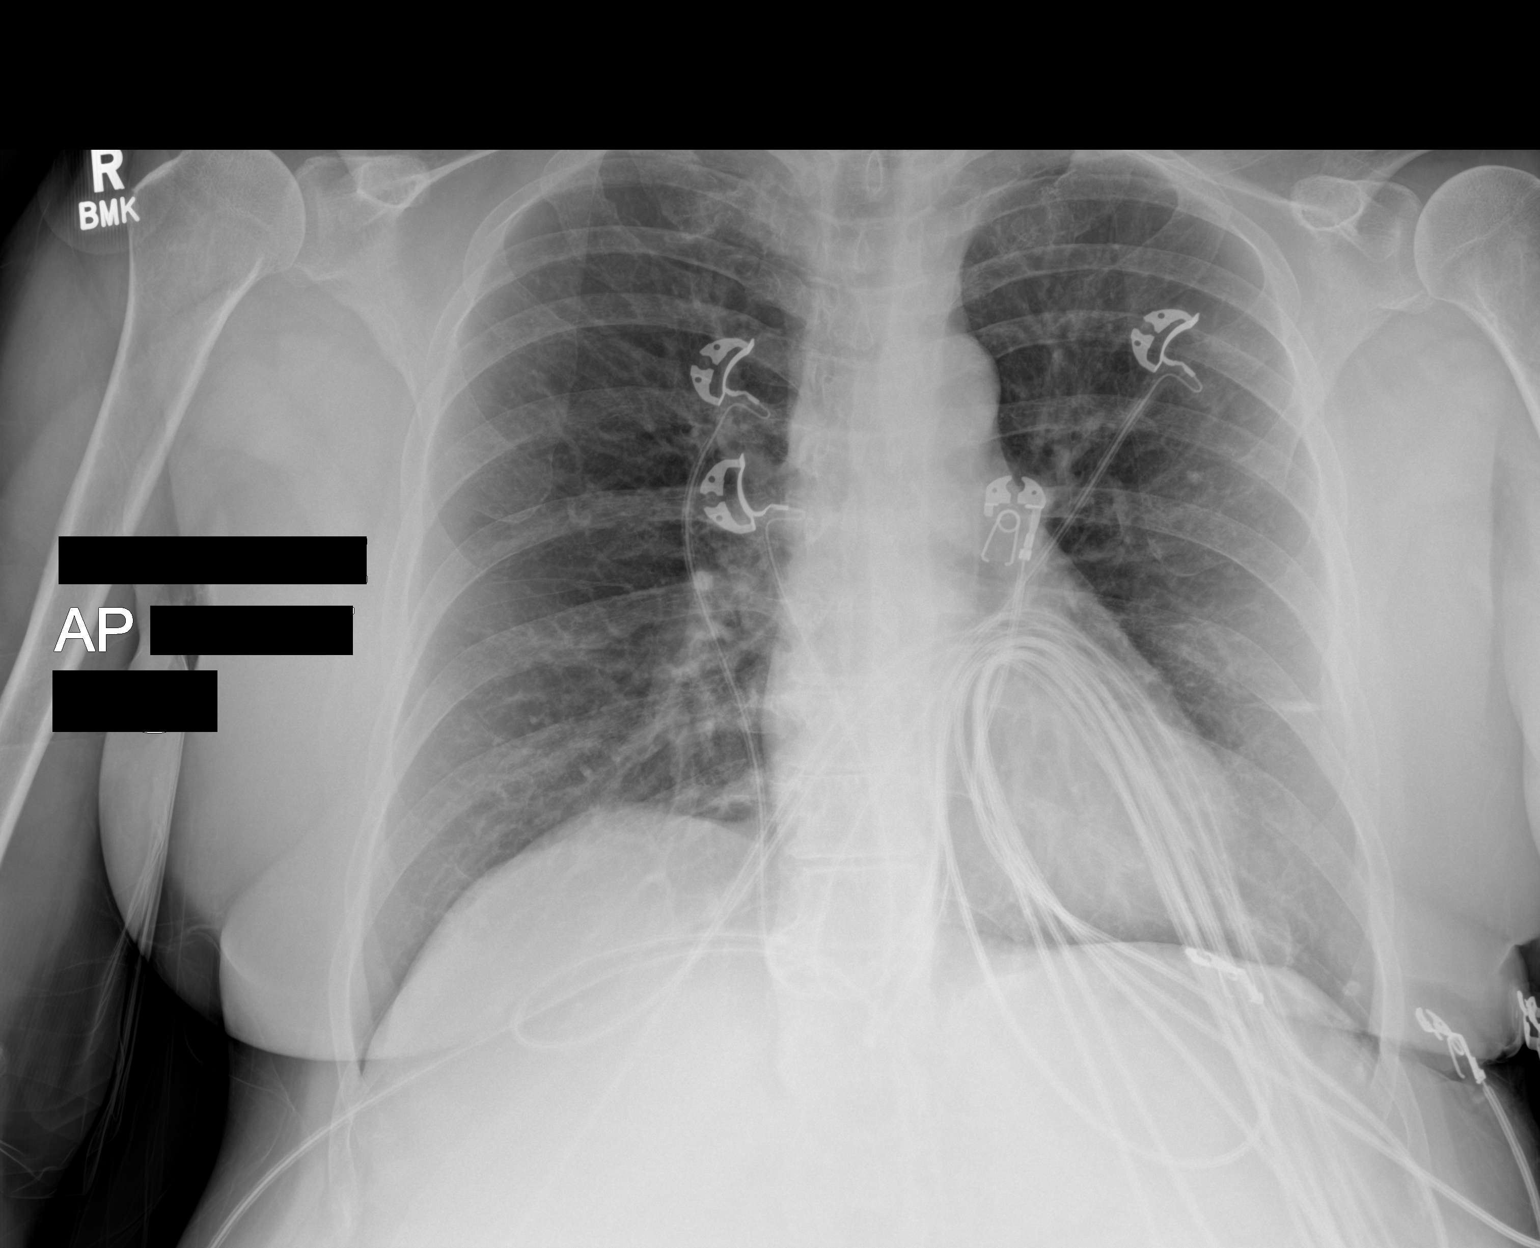

[2 of 2 positions shown; findings below may reference images not displayed]

FINDINGS: Lungs are well expanded, symmetric, and clear save for minimal
linear atelectasis within the left lung base. No pneumothorax or
pleural effusion. Cardiac size within normal limits. Pulmonary
vascularity is normal. Osseous structures are age-appropriate. No
acute bone abnormality.
IMPRESSION: No active disease.

## 2022-10-01 DIAGNOSIS — U071 COVID: Principal | ICD-10-CM

## 2022-10-01 MED ORDER — MOLNUPIRAVIR 200 MG CAPSULE (EUA)
ORAL_CAPSULE | Freq: Two times a day (BID) | ORAL | 0 refills | 5 days | Status: CP
Start: 2022-10-01 — End: 2022-10-06

## 2022-10-03 DIAGNOSIS — G4700 Insomnia, unspecified: Principal | ICD-10-CM

## 2022-10-03 MED ORDER — DOXEPIN 10 MG CAPSULE
ORAL_CAPSULE | 1 refills | 0 days
Start: 2022-10-03 — End: ?

## 2022-10-04 MED ORDER — DOXEPIN 10 MG CAPSULE
ORAL_CAPSULE | 2 refills | 0 days | Status: CP
Start: 2022-10-04 — End: ?

## 2022-10-18 DIAGNOSIS — F411 Generalized anxiety disorder: Principal | ICD-10-CM

## 2022-10-18 DIAGNOSIS — F410 Panic disorder [episodic paroxysmal anxiety] without agoraphobia: Principal | ICD-10-CM

## 2022-10-18 MED ORDER — ALPRAZOLAM 0.5 MG TABLET
ORAL_TABLET | 0 refills | 0 days
Start: 2022-10-18 — End: ?

## 2022-10-19 MED ORDER — ALPRAZOLAM 0.5 MG TABLET
ORAL_TABLET | 0 refills | 0 days | Status: CP
Start: 2022-10-19 — End: ?

## 2022-10-29 DIAGNOSIS — I10 Essential (primary) hypertension: Principal | ICD-10-CM

## 2022-10-29 DIAGNOSIS — I4891 Unspecified atrial fibrillation: Principal | ICD-10-CM

## 2022-10-29 MED ORDER — METOPROLOL TARTRATE 25 MG TABLET
ORAL_TABLET | Freq: Every evening | ORAL | 1 refills | 90 days | Status: CP
Start: 2022-10-29 — End: 2023-10-29

## 2022-10-29 MED ORDER — AMLODIPINE 5 MG TABLET
ORAL_TABLET | Freq: Every day | ORAL | 1 refills | 90 days | Status: CP
Start: 2022-10-29 — End: 2023-10-29

## 2022-10-29 MED ORDER — BENAZEPRIL 40 MG TABLET
ORAL_TABLET | Freq: Every day | ORAL | 1 refills | 90 days | Status: CP
Start: 2022-10-29 — End: 2023-10-29

## 2022-10-29 MED ORDER — HYDROCHLOROTHIAZIDE 25 MG TABLET
ORAL_TABLET | Freq: Every day | ORAL | 1 refills | 90 days | Status: CP
Start: 2022-10-29 — End: 2023-10-29

## 2022-10-29 MED ORDER — APIXABAN 5 MG TABLET
ORAL_TABLET | Freq: Two times a day (BID) | ORAL | 1 refills | 90 days | Status: CP
Start: 2022-10-29 — End: 2023-10-29

## 2023-01-25 DIAGNOSIS — F411 Generalized anxiety disorder: Principal | ICD-10-CM

## 2023-01-25 DIAGNOSIS — F410 Panic disorder [episodic paroxysmal anxiety] without agoraphobia: Principal | ICD-10-CM

## 2023-01-25 MED ORDER — ALPRAZOLAM 0.5 MG TABLET
ORAL_TABLET | 0 refills | 0 days
Start: 2023-01-25 — End: ?

## 2023-01-26 MED ORDER — ALPRAZOLAM 0.5 MG TABLET
ORAL_TABLET | 1 refills | 0 days | Status: CP
Start: 2023-01-26 — End: ?

## 2023-02-14 ENCOUNTER — Ambulatory Visit: Admit: 2023-02-14 | Discharge: 2023-02-15 | Payer: PRIVATE HEALTH INSURANCE

## 2023-02-14 ENCOUNTER — Ambulatory Visit
Admit: 2023-02-14 | Discharge: 2023-02-15 | Payer: PRIVATE HEALTH INSURANCE | Attending: Infectious Disease | Primary: Infectious Disease

## 2023-02-14 DIAGNOSIS — B20 Human immunodeficiency virus [HIV] disease: Principal | ICD-10-CM

## 2023-02-14 DIAGNOSIS — I4891 Unspecified atrial fibrillation: Principal | ICD-10-CM

## 2023-02-14 DIAGNOSIS — I10 Essential (primary) hypertension: Principal | ICD-10-CM

## 2023-02-14 DIAGNOSIS — F17210 Nicotine dependence, cigarettes, uncomplicated: Principal | ICD-10-CM

## 2023-02-14 MED ORDER — METOPROLOL SUCCINATE ER 50 MG TABLET,EXTENDED RELEASE 24 HR
ORAL_TABLET | Freq: Every day | ORAL | 2 refills | 90 days | Status: CP
Start: 2023-02-14 — End: 2024-02-14

## 2023-02-18 MED ORDER — METOPROLOL SUCCINATE ER 50 MG TABLET,EXTENDED RELEASE 24 HR
ORAL_TABLET | Freq: Two times a day (BID) | ORAL | 2 refills | 45 days | Status: CP
Start: 2023-02-18 — End: 2024-02-18

## 2023-03-10 ENCOUNTER — Ambulatory Visit: Admit: 2023-03-10 | Discharge: 2023-03-10 | Payer: PRIVATE HEALTH INSURANCE

## 2023-03-10 ENCOUNTER — Ambulatory Visit
Admit: 2023-03-10 | Discharge: 2023-03-10 | Payer: PRIVATE HEALTH INSURANCE | Attending: Psychiatry | Primary: Psychiatry

## 2023-03-17 DIAGNOSIS — R911 Solitary pulmonary nodule: Principal | ICD-10-CM

## 2023-03-17 DIAGNOSIS — R9389 Abnormal findings on diagnostic imaging of other specified body structures: Principal | ICD-10-CM

## 2023-03-18 DIAGNOSIS — R918 Other nonspecific abnormal finding of lung field: Principal | ICD-10-CM

## 2023-03-21 ENCOUNTER — Telehealth
Admit: 2023-03-21 | Discharge: 2023-03-22 | Payer: PRIVATE HEALTH INSURANCE | Attending: Infectious Disease | Primary: Infectious Disease

## 2023-03-21 DIAGNOSIS — Z1211 Encounter for screening for malignant neoplasm of colon: Principal | ICD-10-CM

## 2023-03-21 DIAGNOSIS — D7589 Other specified diseases of blood and blood-forming organs: Principal | ICD-10-CM

## 2023-03-21 DIAGNOSIS — B20 Human immunodeficiency virus [HIV] disease: Principal | ICD-10-CM

## 2023-04-11 DIAGNOSIS — B20 Human immunodeficiency virus [HIV] disease: Principal | ICD-10-CM

## 2023-04-11 DIAGNOSIS — Z13220 Encounter for screening for lipoid disorders: Principal | ICD-10-CM

## 2023-04-11 DIAGNOSIS — Z9189 Other specified personal risk factors, not elsewhere classified: Principal | ICD-10-CM

## 2023-04-21 ENCOUNTER — Telehealth
Admit: 2023-04-21 | Discharge: 2023-04-22 | Payer: PRIVATE HEALTH INSURANCE | Attending: Psychiatry | Primary: Psychiatry

## 2023-04-21 DIAGNOSIS — F411 Generalized anxiety disorder: Principal | ICD-10-CM

## 2023-04-21 DIAGNOSIS — G4700 Insomnia, unspecified: Principal | ICD-10-CM

## 2023-04-21 DIAGNOSIS — F32A Depression, unspecified depression type: Principal | ICD-10-CM

## 2023-04-21 DIAGNOSIS — F410 Panic disorder [episodic paroxysmal anxiety] without agoraphobia: Principal | ICD-10-CM

## 2023-04-21 MED ORDER — TRAZODONE 50 MG TABLET
ORAL_TABLET | 3 refills | 0 days | Status: CP
Start: 2023-04-21 — End: ?

## 2023-04-21 MED ORDER — ESCITALOPRAM 10 MG TABLET
ORAL_TABLET | Freq: Every day | ORAL | 3 refills | 90 days | Status: CP
Start: 2023-04-21 — End: ?

## 2023-05-03 ENCOUNTER — Emergency Department
Admit: 2023-05-03 | Discharge: 2023-05-03 | Disposition: A | Discharge status: Home / Self Care | Payer: PRIVATE HEALTH INSURANCE | Attending: Student in an Organized Health Care Education/Training Program

## 2023-05-03 ENCOUNTER — Ambulatory Visit
Admit: 2023-05-03 | Discharge: 2023-05-03 | Disposition: A | Discharge status: Home / Self Care | Payer: PRIVATE HEALTH INSURANCE | Attending: Student in an Organized Health Care Education/Training Program

## 2023-05-03 MED ORDER — PREDNISONE 20 MG TABLET
ORAL_TABLET | Freq: Every day | ORAL | 0 refills | 5 days | Status: CP
Start: 2023-05-03 — End: 2023-05-08

## 2023-05-03 MED ORDER — AMLODIPINE 5 MG TABLET
ORAL_TABLET | Freq: Every day | ORAL | 1 refills | 90 days | Status: CP
Start: 2023-05-03 — End: 2024-05-02

## 2023-05-03 MED ORDER — ALBUTEROL SULFATE HFA 90 MCG/ACTUATION AEROSOL INHALER
RESPIRATORY_TRACT | 0 refills | 7 days | Status: CP | PRN
Start: 2023-05-03 — End: 2023-05-10

## 2023-05-03 MED ORDER — HYDROCHLOROTHIAZIDE 25 MG TABLET
ORAL_TABLET | Freq: Every day | ORAL | 1 refills | 90 days | Status: CP
Start: 2023-05-03 — End: 2024-05-02

## 2023-05-03 MED ORDER — BENAZEPRIL 40 MG TABLET
ORAL_TABLET | Freq: Every day | ORAL | 1 refills | 90 days | Status: CP
Start: 2023-05-03 — End: 2024-05-02

## 2023-05-03 MED ORDER — AMOXICILLIN 875 MG-POTASSIUM CLAVULANATE 125 MG TABLET
ORAL_TABLET | Freq: Two times a day (BID) | ORAL | 0 refills | 10 days | Status: CP
Start: 2023-05-03 — End: 2023-05-13

## 2023-05-05 DIAGNOSIS — B20 Human immunodeficiency virus [HIV] disease: Principal | ICD-10-CM

## 2023-05-05 MED ORDER — ODEFSEY 200 MG-25 MG-25 MG TABLET
ORAL_TABLET | 3 refills | 0 days
Start: 2023-05-05 — End: ?

## 2023-05-09 MED ORDER — ODEFSEY 200 MG-25 MG-25 MG TABLET
ORAL_TABLET | 3 refills | 0 days | Status: CP
Start: 2023-05-09 — End: ?

## 2023-05-24 ENCOUNTER — Ambulatory Visit: Admit: 2023-05-24 | Discharge: 2023-05-25 | Payer: PRIVATE HEALTH INSURANCE

## 2023-05-24 DIAGNOSIS — F32A Depression, unspecified depression type: Principal | ICD-10-CM

## 2023-05-24 DIAGNOSIS — I4891 Unspecified atrial fibrillation: Principal | ICD-10-CM

## 2023-05-24 DIAGNOSIS — B20 Human immunodeficiency virus [HIV] disease: Principal | ICD-10-CM

## 2023-05-24 DIAGNOSIS — I10 Essential (primary) hypertension: Principal | ICD-10-CM

## 2023-05-24 DIAGNOSIS — R911 Solitary pulmonary nodule: Principal | ICD-10-CM

## 2023-05-24 DIAGNOSIS — Z1321 Encounter for screening for nutritional disorder: Principal | ICD-10-CM

## 2023-05-24 DIAGNOSIS — R202 Paresthesia of skin: Principal | ICD-10-CM

## 2023-05-24 DIAGNOSIS — R200 Anesthesia of skin: Principal | ICD-10-CM

## 2023-05-24 DIAGNOSIS — Z0000 Encounter for general adult medical examination without abnormal findings: Principal | ICD-10-CM

## 2023-05-24 DIAGNOSIS — R9389 Abnormal findings on diagnostic imaging of other specified body structures: Principal | ICD-10-CM

## 2023-05-24 DIAGNOSIS — M25522 Pain in left elbow: Principal | ICD-10-CM

## 2023-05-24 DIAGNOSIS — F411 Generalized anxiety disorder: Principal | ICD-10-CM

## 2023-05-24 DIAGNOSIS — Z1329 Encounter for screening for other suspected endocrine disorder: Principal | ICD-10-CM

## 2023-05-24 DIAGNOSIS — F410 Panic disorder [episodic paroxysmal anxiety] without agoraphobia: Principal | ICD-10-CM

## 2023-05-24 DIAGNOSIS — Z131 Encounter for screening for diabetes mellitus: Principal | ICD-10-CM

## 2023-05-24 DIAGNOSIS — G4700 Insomnia, unspecified: Principal | ICD-10-CM

## 2023-05-24 DIAGNOSIS — F17210 Nicotine dependence, cigarettes, uncomplicated: Principal | ICD-10-CM

## 2023-05-24 DIAGNOSIS — Z13220 Encounter for screening for lipoid disorders: Principal | ICD-10-CM

## 2023-05-24 MED ORDER — ATORVASTATIN 40 MG TABLET
ORAL_TABLET | Freq: Every day | ORAL | 2 refills | 90 days | Status: CP
Start: 2023-05-24 — End: ?

## 2023-07-07 ENCOUNTER — Ambulatory Visit
Admit: 2023-07-07 | Discharge: 2023-07-08 | Payer: PRIVATE HEALTH INSURANCE | Attending: Student in an Organized Health Care Education/Training Program | Primary: Student in an Organized Health Care Education/Training Program

## 2023-07-07 DIAGNOSIS — F411 Generalized anxiety disorder: Principal | ICD-10-CM

## 2023-07-07 DIAGNOSIS — F410 Panic disorder [episodic paroxysmal anxiety] without agoraphobia: Principal | ICD-10-CM

## 2023-07-07 MED ORDER — ALPRAZOLAM 0.5 MG TABLET
ORAL_TABLET | 0 refills | 0 days
Start: 2023-07-07 — End: ?

## 2023-07-08 DIAGNOSIS — I10 Essential (primary) hypertension: Principal | ICD-10-CM

## 2023-07-08 DIAGNOSIS — F411 Generalized anxiety disorder: Principal | ICD-10-CM

## 2023-07-08 DIAGNOSIS — F410 Panic disorder [episodic paroxysmal anxiety] without agoraphobia: Principal | ICD-10-CM

## 2023-07-08 MED ORDER — AMLODIPINE 5 MG TABLET
ORAL | 1 refills | 90 days | Status: CP
Start: 2023-07-08 — End: 2024-07-07

## 2023-07-08 MED ORDER — ALPRAZOLAM 0.5 MG TABLET
ORAL_TABLET | ORAL | 0 refills | 0.00000 days | Status: CP
Start: 2023-07-08 — End: ?

## 2023-07-11 ENCOUNTER — Ambulatory Visit
Admit: 2023-07-11 | Discharge: 2023-07-12 | Payer: PRIVATE HEALTH INSURANCE | Attending: Orthopaedic Surgery | Primary: Orthopaedic Surgery

## 2023-07-14 DIAGNOSIS — G5602 Carpal tunnel syndrome, left upper limb: Principal | ICD-10-CM

## 2023-07-23 MED ORDER — HYDROCODONE 5 MG-ACETAMINOPHEN 325 MG TABLET T-HOME
ORAL_TABLET | Freq: Four times a day (QID) | ORAL | 0 refills | 2 days | PRN
Start: 2023-07-23 — End: ?

## 2023-07-29 ENCOUNTER — Ambulatory Visit: Admit: 2023-07-29 | Discharge: 2023-07-29 | Payer: PRIVATE HEALTH INSURANCE

## 2023-07-29 ENCOUNTER — Encounter
Admit: 2023-07-29 | Discharge: 2023-07-29 | Payer: PRIVATE HEALTH INSURANCE | Attending: Student in an Organized Health Care Education/Training Program | Primary: Student in an Organized Health Care Education/Training Program

## 2023-07-29 MED ORDER — HYDROCODONE 5 MG-ACETAMINOPHEN 325 MG TABLET T-HOME
ORAL_TABLET | Freq: Four times a day (QID) | ORAL | 0 refills | 2 days | Status: CP | PRN
Start: 2023-07-29 — End: ?

## 2023-08-08 ENCOUNTER — Ambulatory Visit
Admit: 2023-08-08 | Discharge: 2023-08-09 | Payer: PRIVATE HEALTH INSURANCE | Attending: Orthopaedic Surgery | Primary: Orthopaedic Surgery

## 2023-08-08 DIAGNOSIS — I10 Essential (primary) hypertension: Principal | ICD-10-CM

## 2023-08-08 MED ORDER — BENAZEPRIL 40 MG TABLET
ORAL_TABLET | Freq: Every day | ORAL | 1 refills | 90 days | Status: CP
Start: 2023-08-08 — End: 2024-08-07

## 2023-08-08 MED ORDER — HYDROCHLOROTHIAZIDE 25 MG TABLET
ORAL_TABLET | Freq: Every day | ORAL | 1 refills | 90 days | Status: CP
Start: 2023-08-08 — End: 2024-08-07

## 2023-08-18 ENCOUNTER — Telehealth
Admit: 2023-08-18 | Discharge: 2023-08-19 | Payer: PRIVATE HEALTH INSURANCE | Attending: Psychiatry | Primary: Psychiatry

## 2023-08-18 DIAGNOSIS — F411 Generalized anxiety disorder: Principal | ICD-10-CM

## 2023-08-18 DIAGNOSIS — F410 Panic disorder [episodic paroxysmal anxiety] without agoraphobia: Principal | ICD-10-CM

## 2023-08-18 DIAGNOSIS — G4700 Insomnia, unspecified: Principal | ICD-10-CM

## 2023-08-18 DIAGNOSIS — F32A Depression, unspecified depression type: Principal | ICD-10-CM

## 2023-09-02 DIAGNOSIS — I4891 Unspecified atrial fibrillation: Principal | ICD-10-CM

## 2023-09-02 MED ORDER — APIXABAN 5 MG TABLET
ORAL_TABLET | Freq: Two times a day (BID) | ORAL | 1 refills | 90 days
Start: 2023-09-02 — End: 2024-09-01

## 2023-09-05 MED ORDER — APIXABAN 5 MG TABLET
ORAL_TABLET | Freq: Two times a day (BID) | ORAL | 1 refills | 90 days | Status: CP
Start: 2023-09-05 — End: 2024-09-04

## 2023-10-04 DIAGNOSIS — I10 Essential (primary) hypertension: Principal | ICD-10-CM

## 2023-10-04 MED ORDER — METOPROLOL SUCCINATE ER 50 MG TABLET,EXTENDED RELEASE 24 HR
ORAL_TABLET | Freq: Two times a day (BID) | ORAL | 1 refills | 45 days | Status: CP
Start: 2023-10-04 — End: 2024-10-03

## 2023-10-04 MED ORDER — AMLODIPINE 5 MG TABLET
ORAL_TABLET | Freq: Every day | ORAL | 1 refills | 90 days | Status: CP
Start: 2023-10-04 — End: 2024-10-03

## 2023-10-11 DIAGNOSIS — F411 Generalized anxiety disorder: Principal | ICD-10-CM

## 2023-10-11 DIAGNOSIS — F410 Panic disorder [episodic paroxysmal anxiety] without agoraphobia: Principal | ICD-10-CM

## 2023-10-11 DIAGNOSIS — F32A Depression, unspecified depression type: Principal | ICD-10-CM

## 2023-10-11 DIAGNOSIS — G4700 Insomnia, unspecified: Principal | ICD-10-CM

## 2023-10-11 MED ORDER — ALPRAZOLAM 0.5 MG TABLET
ORAL_TABLET | 1 refills | 0 days
Start: 2023-10-11 — End: ?

## 2023-10-11 MED ORDER — TRAZODONE 50 MG TABLET
ORAL_TABLET | 3 refills | 0 days
Start: 2023-10-11 — End: ?

## 2023-10-11 MED ORDER — ESCITALOPRAM 10 MG TABLET
ORAL_TABLET | Freq: Every day | ORAL | 3 refills | 90 days
Start: 2023-10-11 — End: ?

## 2023-10-12 MED ORDER — ALPRAZOLAM 0.5 MG TABLET
ORAL_TABLET | 1 refills | 0 days | Status: CP
Start: 2023-10-12 — End: ?

## 2023-10-12 MED ORDER — ESCITALOPRAM 10 MG TABLET
ORAL_TABLET | Freq: Every day | ORAL | 3 refills | 90 days | Status: CP
Start: 2023-10-12 — End: ?

## 2023-10-12 MED ORDER — TRAZODONE 50 MG TABLET
ORAL_TABLET | 3 refills | 0 days | Status: CP
Start: 2023-10-12 — End: ?

## 2023-10-22 DIAGNOSIS — F411 Generalized anxiety disorder: Principal | ICD-10-CM

## 2023-10-22 DIAGNOSIS — F32A Depression, unspecified depression type: Principal | ICD-10-CM

## 2023-10-22 DIAGNOSIS — F410 Panic disorder [episodic paroxysmal anxiety] without agoraphobia: Principal | ICD-10-CM

## 2023-10-22 MED ORDER — ESCITALOPRAM 20 MG TABLET
ORAL_TABLET | Freq: Every day | ORAL | 3 refills | 0 days
Start: 2023-10-22 — End: ?

## 2023-10-24 ENCOUNTER — Ambulatory Visit
Admit: 2023-10-24 | Discharge: 2023-10-25 | Payer: BLUE CROSS/BLUE SHIELD | Attending: Infectious Disease | Primary: Infectious Disease

## 2023-10-24 DIAGNOSIS — I10 Essential (primary) hypertension: Principal | ICD-10-CM

## 2023-10-24 DIAGNOSIS — Z122 Encounter for screening for malignant neoplasm of respiratory organs: Principal | ICD-10-CM

## 2023-10-24 DIAGNOSIS — B20 Human immunodeficiency virus [HIV] disease: Principal | ICD-10-CM

## 2023-10-24 DIAGNOSIS — I1A0 Resistant hypertension: Principal | ICD-10-CM

## 2023-10-24 MED ORDER — ESCITALOPRAM 20 MG TABLET
ORAL_TABLET | Freq: Every day | ORAL | 3 refills | 90 days
Start: 2023-10-24 — End: ?

## 2023-10-24 MED ORDER — ATORVASTATIN 80 MG TABLET
ORAL_TABLET | Freq: Every day | ORAL | 3 refills | 90 days | Status: CP
Start: 2023-10-24 — End: 2024-10-23

## 2023-10-31 DIAGNOSIS — Z122 Encounter for screening for malignant neoplasm of respiratory organs: Principal | ICD-10-CM

## 2023-11-07 ENCOUNTER — Emergency Department
Admit: 2023-11-07 | Discharge: 2023-11-07 | Disposition: A | Discharge status: Home / Self Care | Payer: BLUE CROSS/BLUE SHIELD

## 2023-11-07 ENCOUNTER — Ambulatory Visit
Admit: 2023-11-07 | Discharge: 2023-11-07 | Disposition: A | Discharge status: Home / Self Care | Payer: BLUE CROSS/BLUE SHIELD

## 2023-11-07 DIAGNOSIS — W109XXA Fall (on) (from) unspecified stairs and steps, initial encounter: Principal | ICD-10-CM

## 2023-11-07 DIAGNOSIS — S40021A Contusion of right upper arm, initial encounter: Principal | ICD-10-CM

## 2023-11-07 DIAGNOSIS — S8992XA Unspecified injury of left lower leg, initial encounter: Principal | ICD-10-CM

## 2023-11-07 MED ORDER — OXYCODONE 5 MG TABLET
ORAL_TABLET | Freq: Four times a day (QID) | ORAL | 0 refills | 3.00 days | Status: CP | PRN
Start: 2023-11-07 — End: 2023-11-12

## 2023-11-21 ENCOUNTER — Ambulatory Visit: Admit: 2023-11-21 | Discharge: 2023-11-22 | Payer: BLUE CROSS/BLUE SHIELD

## 2023-11-21 DIAGNOSIS — Z20822 Suspected COVID-19 virus infection: Principal | ICD-10-CM

## 2023-11-21 DIAGNOSIS — U071 COVID-19 virus infection: Principal | ICD-10-CM

## 2023-12-19 MED ORDER — ATORVASTATIN 40 MG TABLET
ORAL_TABLET | Freq: Every day | ORAL | 2 refills | 90.00 days
Start: 2023-12-19 — End: ?

## 2023-12-23 ENCOUNTER — Emergency Department
Admit: 2023-12-23 | Discharge: 2023-12-23 | Disposition: A | Discharge status: Home / Self Care | Payer: BLUE CROSS/BLUE SHIELD

## 2023-12-23 DIAGNOSIS — N200 Calculus of kidney: Principal | ICD-10-CM

## 2023-12-23 MED ORDER — ONDANSETRON 4 MG DISINTEGRATING TABLET
ORAL_TABLET | Freq: Three times a day (TID) | 0 refills | 7.00 days | Status: CP | PRN
Start: 2023-12-23 — End: 2023-12-30

## 2023-12-23 MED ORDER — TAMSULOSIN 0.4 MG CAPSULE
ORAL_CAPSULE | Freq: Every day | ORAL | 0 refills | 30.00 days | Status: CP
Start: 2023-12-23 — End: 2024-01-22

## 2023-12-23 MED ORDER — OXYCODONE 5 MG TABLET
ORAL_TABLET | ORAL | 0 refills | 2.00 days | Status: CP | PRN
Start: 2023-12-23 — End: 2023-12-28

## 2024-01-19 ENCOUNTER — Encounter: Admit: 2024-01-19 | Discharge: 2024-01-20 | Payer: BLUE CROSS/BLUE SHIELD | Attending: Psychiatry | Primary: Psychiatry

## 2024-01-19 DIAGNOSIS — G4700 Insomnia, unspecified: Principal | ICD-10-CM

## 2024-01-19 DIAGNOSIS — F32A Depression, unspecified depression type: Principal | ICD-10-CM

## 2024-01-19 DIAGNOSIS — F410 Panic disorder [episodic paroxysmal anxiety] without agoraphobia: Principal | ICD-10-CM

## 2024-01-19 DIAGNOSIS — F411 Generalized anxiety disorder: Principal | ICD-10-CM

## 2024-01-19 MED ORDER — ALPRAZOLAM 0.5 MG TABLET
ORAL_TABLET | 1 refills | 0.00 days | Status: CP
Start: 2024-01-19 — End: ?

## 2024-01-20 DIAGNOSIS — G4700 Insomnia, unspecified: Principal | ICD-10-CM

## 2024-01-20 MED ORDER — TRAZODONE 50 MG TABLET
ORAL_TABLET | 3 refills | 0.00 days
Start: 2024-01-20 — End: ?

## 2024-01-23 MED ORDER — TRAZODONE 50 MG TABLET
ORAL_TABLET | 3 refills | 0.00 days | Status: CP
Start: 2024-01-23 — End: ?

## 2024-02-09 DIAGNOSIS — I10 Essential (primary) hypertension: Principal | ICD-10-CM

## 2024-02-09 MED ORDER — HYDROCHLOROTHIAZIDE 25 MG TABLET
ORAL_TABLET | Freq: Every day | ORAL | 1 refills | 90.00 days | Status: CP
Start: 2024-02-09 — End: 2025-02-08

## 2024-02-09 MED ORDER — BENAZEPRIL 40 MG TABLET
ORAL_TABLET | Freq: Every day | ORAL | 1 refills | 90.00 days | Status: CP
Start: 2024-02-09 — End: 2025-02-08

## 2024-03-08 ENCOUNTER — Encounter: Admit: 2024-03-08 | Discharge: 2024-03-09 | Payer: BLUE CROSS/BLUE SHIELD | Attending: Psychiatry | Primary: Psychiatry

## 2024-03-08 DIAGNOSIS — G4700 Insomnia, unspecified: Principal | ICD-10-CM

## 2024-03-08 DIAGNOSIS — F32A Depression, unspecified depression type: Principal | ICD-10-CM

## 2024-03-08 DIAGNOSIS — F410 Panic disorder [episodic paroxysmal anxiety] without agoraphobia: Principal | ICD-10-CM

## 2024-03-08 DIAGNOSIS — F411 Generalized anxiety disorder: Principal | ICD-10-CM

## 2024-03-19 DIAGNOSIS — I10 Essential (primary) hypertension: Principal | ICD-10-CM

## 2024-03-19 DIAGNOSIS — I4891 Unspecified atrial fibrillation: Principal | ICD-10-CM

## 2024-03-19 MED ORDER — AMLODIPINE 5 MG TABLET
ORAL_TABLET | Freq: Every day | ORAL | 0 refills | 90.00000 days | Status: CP
Start: 2024-03-19 — End: 2025-03-19

## 2024-03-19 MED ORDER — APIXABAN 5 MG TABLET
ORAL_TABLET | Freq: Two times a day (BID) | ORAL | 0 refills | 90.00000 days | Status: CP
Start: 2024-03-19 — End: 2025-03-19

## 2024-03-19 MED ORDER — METOPROLOL SUCCINATE ER 50 MG TABLET,EXTENDED RELEASE 24 HR
ORAL_TABLET | Freq: Two times a day (BID) | ORAL | 0 refills | 90.00000 days | Status: CP
Start: 2024-03-19 — End: 2025-03-19

## 2024-03-20 DIAGNOSIS — B20 Human immunodeficiency virus [HIV] disease: Principal | ICD-10-CM

## 2024-03-20 MED ORDER — ODEFSEY 200 MG-25 MG-25 MG TABLET
ORAL_TABLET | 3 refills | 0.00000 days
Start: 2024-03-20 — End: ?

## 2024-03-21 MED ORDER — ODEFSEY 200 MG-25 MG-25 MG TABLET
ORAL_TABLET | ORAL | 0 refills | 0.00000 days | Status: CP
Start: 2024-03-21 — End: ?

## 2024-04-11 DIAGNOSIS — F411 Generalized anxiety disorder: Principal | ICD-10-CM

## 2024-04-11 DIAGNOSIS — F410 Panic disorder [episodic paroxysmal anxiety] without agoraphobia: Principal | ICD-10-CM

## 2024-04-11 MED ORDER — ALPRAZOLAM 0.5 MG TABLET
ORAL_TABLET | ORAL | 1 refills | 0.00000 days | Status: CP
Start: 2024-04-11 — End: ?

## 2024-04-18 DIAGNOSIS — F410 Panic disorder [episodic paroxysmal anxiety] without agoraphobia: Principal | ICD-10-CM

## 2024-04-18 DIAGNOSIS — F411 Generalized anxiety disorder: Principal | ICD-10-CM

## 2024-04-18 MED ORDER — ALPRAZOLAM 0.5 MG TABLET
ORAL_TABLET | ORAL | 1 refills | 0.00000 days | Status: CP
Start: 2024-04-18 — End: ?

## 2024-04-27 DIAGNOSIS — I10 Essential (primary) hypertension: Principal | ICD-10-CM

## 2024-04-27 MED ORDER — METOPROLOL SUCCINATE ER 50 MG TABLET,EXTENDED RELEASE 24 HR
ORAL_TABLET | Freq: Two times a day (BID) | ORAL | 0 refills | 90.00000 days | Status: CN
Start: 2024-04-27 — End: 2025-04-27

## 2024-04-29 MED ORDER — METOPROLOL SUCCINATE ER 50 MG TABLET,EXTENDED RELEASE 24 HR
ORAL_TABLET | Freq: Two times a day (BID) | ORAL | 0 refills | 90.00000 days | Status: CP
Start: 2024-04-29 — End: 2025-04-29

## 2024-05-24 ENCOUNTER — Encounter: Admit: 2024-05-24 | Discharge: 2024-05-24 | Payer: BLUE CROSS/BLUE SHIELD

## 2024-05-24 DIAGNOSIS — Z130 Encounter for screening for diseases of the blood and blood-forming organs and certain disorders involving the immune mechanism: Principal | ICD-10-CM

## 2024-05-24 DIAGNOSIS — B20 Human immunodeficiency virus [HIV] disease: Principal | ICD-10-CM

## 2024-05-24 DIAGNOSIS — Z13220 Encounter for screening for lipoid disorders: Principal | ICD-10-CM

## 2024-05-24 DIAGNOSIS — L03012 Cellulitis of left finger: Principal | ICD-10-CM

## 2024-05-24 DIAGNOSIS — Z1321 Encounter for screening for nutritional disorder: Principal | ICD-10-CM

## 2024-05-24 DIAGNOSIS — Z1231 Encounter for screening mammogram for malignant neoplasm of breast: Principal | ICD-10-CM

## 2024-05-24 DIAGNOSIS — F411 Generalized anxiety disorder: Principal | ICD-10-CM

## 2024-05-24 DIAGNOSIS — Z131 Encounter for screening for diabetes mellitus: Principal | ICD-10-CM

## 2024-05-24 DIAGNOSIS — I4891 Unspecified atrial fibrillation: Principal | ICD-10-CM

## 2024-05-24 DIAGNOSIS — F32A Moderate depressive disorder: Principal | ICD-10-CM

## 2024-05-24 DIAGNOSIS — Z1329 Encounter for screening for other suspected endocrine disorder: Principal | ICD-10-CM

## 2024-05-24 DIAGNOSIS — Z0000 Encounter for general adult medical examination without abnormal findings: Principal | ICD-10-CM

## 2024-05-24 DIAGNOSIS — G4700 Insomnia, unspecified: Principal | ICD-10-CM

## 2024-05-24 DIAGNOSIS — E785 Hyperlipidemia, unspecified: Principal | ICD-10-CM

## 2024-05-24 DIAGNOSIS — F410 Panic disorder [episodic paroxysmal anxiety] without agoraphobia: Principal | ICD-10-CM

## 2024-05-24 DIAGNOSIS — I10 Essential (primary) hypertension: Principal | ICD-10-CM

## 2024-05-24 MED ORDER — METOPROLOL SUCCINATE ER 50 MG TABLET,EXTENDED RELEASE 24 HR
ORAL_TABLET | Freq: Two times a day (BID) | ORAL | 3 refills | 90.00000 days | Status: CP
Start: 2024-05-24 — End: 2025-05-24

## 2024-05-24 MED ORDER — BENAZEPRIL 40 MG TABLET
ORAL_TABLET | Freq: Every day | ORAL | 3 refills | 90.00000 days | Status: CP
Start: 2024-05-24 — End: 2025-05-24

## 2024-05-24 MED ORDER — ATORVASTATIN 80 MG TABLET
ORAL_TABLET | Freq: Every day | ORAL | 3 refills | 90.00000 days | Status: CP
Start: 2024-05-24 — End: 2025-05-24

## 2024-05-24 MED ORDER — AMLODIPINE 5 MG TABLET
ORAL_TABLET | Freq: Every day | ORAL | 3 refills | 90.00000 days | Status: CP
Start: 2024-05-24 — End: 2025-05-24

## 2024-05-24 MED ORDER — SULFAMETHOXAZOLE 800 MG-TRIMETHOPRIM 160 MG TABLET
ORAL_TABLET | Freq: Two times a day (BID) | ORAL | 0 refills | 10.00000 days | Status: CP
Start: 2024-05-24 — End: 2024-06-03

## 2024-05-24 MED ORDER — HYDROCHLOROTHIAZIDE 25 MG TABLET
ORAL_TABLET | Freq: Every day | ORAL | 3 refills | 90.00000 days | Status: CP
Start: 2024-05-24 — End: 2025-05-24

## 2024-05-24 MED ORDER — APIXABAN 5 MG TABLET
ORAL_TABLET | Freq: Two times a day (BID) | ORAL | 3 refills | 90.00000 days | Status: CP
Start: 2024-05-24 — End: 2025-05-24

## 2024-06-10 DIAGNOSIS — B20 Human immunodeficiency virus [HIV] disease: Principal | ICD-10-CM

## 2024-06-10 MED ORDER — ODEFSEY 200 MG-25 MG-25 MG TABLET
ORAL_TABLET | 3 refills | 0.00000 days
Start: 2024-06-10 — End: ?

## 2024-06-11 MED ORDER — ODEFSEY 200 MG-25 MG-25 MG TABLET
ORAL_TABLET | ORAL | 0 refills | 0.00000 days | Status: CP
Start: 2024-06-11 — End: ?

## 2024-06-18 ENCOUNTER — Inpatient Hospital Stay: Admit: 2024-06-18 | Discharge: 2024-06-18 | Payer: BLUE CROSS/BLUE SHIELD

## 2024-06-22 ENCOUNTER — Emergency Department
Admit: 2024-06-22 | Discharge: 2024-06-22 | Disposition: A | Discharge status: Home / Self Care | Payer: BLUE CROSS/BLUE SHIELD | Attending: Family Medicine

## 2024-06-22 DIAGNOSIS — N200 Calculus of kidney: Principal | ICD-10-CM

## 2024-06-22 DIAGNOSIS — R112 Nausea with vomiting, unspecified: Principal | ICD-10-CM

## 2024-06-22 MED ORDER — PROMETHAZINE 25 MG TABLET
ORAL_TABLET | Freq: Four times a day (QID) | ORAL | 0 refills | 5.00000 days | Status: CP | PRN
Start: 2024-06-22 — End: 2024-06-29

## 2024-06-22 MED ORDER — OXYCODONE 5 MG TABLET
ORAL_TABLET | Freq: Four times a day (QID) | ORAL | 0 refills | 3.00000 days | Status: CP | PRN
Start: 2024-06-22 — End: 2024-06-27

## 2024-06-22 MED ORDER — TAMSULOSIN 0.4 MG CAPSULE
ORAL_CAPSULE | Freq: Every day | ORAL | 0 refills | 14.00000 days | Status: CP
Start: 2024-06-22 — End: 2024-07-06

## 2024-06-22 MED ORDER — ONDANSETRON 4 MG DISINTEGRATING TABLET
ORAL_TABLET | Freq: Three times a day (TID) | ORAL | 0 refills | 4.00000 days | Status: CP | PRN
Start: 2024-06-22 — End: 2024-06-29

## 2024-07-05 DIAGNOSIS — F410 Panic disorder [episodic paroxysmal anxiety] without agoraphobia: Principal | ICD-10-CM

## 2024-07-05 DIAGNOSIS — F411 Generalized anxiety disorder: Principal | ICD-10-CM

## 2024-07-05 DIAGNOSIS — G4700 Insomnia, unspecified: Principal | ICD-10-CM

## 2024-07-05 DIAGNOSIS — F32A Moderate depressive disorder: Principal | ICD-10-CM

## 2024-07-13 DIAGNOSIS — I4891 Unspecified atrial fibrillation: Principal | ICD-10-CM

## 2024-07-13 DIAGNOSIS — I10 Essential (primary) hypertension: Principal | ICD-10-CM

## 2024-07-13 MED ORDER — ELIQUIS 5 MG TABLET
ORAL_TABLET | Freq: Two times a day (BID) | ORAL | 3 refills | 0.00000 days
Start: 2024-07-13 — End: ?

## 2024-07-13 MED ORDER — AMLODIPINE 5 MG TABLET
ORAL_TABLET | Freq: Every day | ORAL | 3 refills | 0.00000 days
Start: 2024-07-13 — End: ?

## 2024-07-15 DIAGNOSIS — I10 Essential (primary) hypertension: Principal | ICD-10-CM

## 2024-07-15 MED ORDER — METOPROLOL SUCCINATE ER 50 MG TABLET,EXTENDED RELEASE 24 HR
ORAL_TABLET | Freq: Two times a day (BID) | ORAL | 3 refills | 0.00000 days
Start: 2024-07-15 — End: ?

## 2024-07-16 DIAGNOSIS — F411 Generalized anxiety disorder: Principal | ICD-10-CM

## 2024-07-16 DIAGNOSIS — F410 Panic disorder [episodic paroxysmal anxiety] without agoraphobia: Principal | ICD-10-CM

## 2024-07-16 MED ORDER — METOPROLOL SUCCINATE ER 50 MG TABLET,EXTENDED RELEASE 24 HR
ORAL_TABLET | Freq: Two times a day (BID) | ORAL | 3 refills | 90.00000 days
Start: 2024-07-16 — End: ?

## 2024-07-16 MED ORDER — ALPRAZOLAM 0.5 MG TABLET
ORAL_TABLET | ORAL | 1 refills | 0.00000 days | Status: CP
Start: 2024-07-16 — End: ?

## 2024-07-17 MED ORDER — AMLODIPINE 5 MG TABLET
ORAL_TABLET | Freq: Every day | ORAL | 3 refills | 90.00000 days | Status: CP
Start: 2024-07-17 — End: ?

## 2024-07-17 MED ORDER — ELIQUIS 5 MG TABLET
ORAL_TABLET | Freq: Two times a day (BID) | ORAL | 3 refills | 90.00000 days | Status: CP
Start: 2024-07-17 — End: ?

## 2024-07-19 DIAGNOSIS — B20 Human immunodeficiency virus [HIV] disease: Principal | ICD-10-CM

## 2024-07-19 MED ORDER — ODEFSEY 200 MG-25 MG-25 MG TABLET
ORAL_TABLET | Freq: Every day | ORAL | 11 refills | 0.00000 days
Start: 2024-07-19 — End: ?

## 2024-07-20 MED ORDER — ODEFSEY 200 MG-25 MG-25 MG TABLET
ORAL_TABLET | Freq: Every day | ORAL | 1 refills | 30.00000 days | Status: CP
Start: 2024-07-20 — End: ?

## 2024-08-28 DIAGNOSIS — B20 Human immunodeficiency virus [HIV] disease: Principal | ICD-10-CM

## 2024-08-28 MED ORDER — ODEFSEY 200 MG-25 MG-25 MG TABLET
ORAL_TABLET | Freq: Every day | ORAL | 5 refills | 0.00000 days
Start: 2024-08-28 — End: ?

## 2024-08-29 MED ORDER — ODEFSEY 200 MG-25 MG-25 MG TABLET
ORAL_TABLET | Freq: Every day | ORAL | 1 refills | 30.00000 days | Status: CP
Start: 2024-08-29 — End: ?

## 2024-09-27 DIAGNOSIS — L578 Other skin changes due to chronic exposure to nonionizing radiation: Principal | ICD-10-CM

## 2024-09-27 DIAGNOSIS — L570 Actinic keratosis: Principal | ICD-10-CM

## 2024-09-27 DIAGNOSIS — Z9225 Personal history of immunosupression therapy: Principal | ICD-10-CM

## 2024-10-08 DIAGNOSIS — I10 Essential (primary) hypertension: Principal | ICD-10-CM

## 2024-10-08 DIAGNOSIS — Z1321 Encounter for screening for nutritional disorder: Principal | ICD-10-CM

## 2024-10-08 DIAGNOSIS — I4891 Unspecified atrial fibrillation: Principal | ICD-10-CM

## 2024-10-08 DIAGNOSIS — Z130 Encounter for screening for diseases of the blood and blood-forming organs and certain disorders involving the immune mechanism: Principal | ICD-10-CM

## 2024-10-08 DIAGNOSIS — Z131 Encounter for screening for diabetes mellitus: Principal | ICD-10-CM

## 2024-10-08 DIAGNOSIS — Z0000 Encounter for general adult medical examination without abnormal findings: Principal | ICD-10-CM

## 2024-10-08 DIAGNOSIS — E785 Hyperlipidemia, unspecified: Principal | ICD-10-CM

## 2024-10-08 DIAGNOSIS — R928 Other abnormal and inconclusive findings on diagnostic imaging of breast: Principal | ICD-10-CM

## 2024-10-08 DIAGNOSIS — B20 Human immunodeficiency virus [HIV] disease: Principal | ICD-10-CM

## 2024-10-08 DIAGNOSIS — L03012 Cellulitis of left finger: Principal | ICD-10-CM

## 2024-10-08 DIAGNOSIS — Z1231 Encounter for screening mammogram for malignant neoplasm of breast: Principal | ICD-10-CM

## 2024-10-08 DIAGNOSIS — F32A Moderate depressive disorder: Principal | ICD-10-CM

## 2024-10-08 DIAGNOSIS — F411 Generalized anxiety disorder: Principal | ICD-10-CM

## 2024-10-08 DIAGNOSIS — Z1329 Encounter for screening for other suspected endocrine disorder: Principal | ICD-10-CM

## 2024-10-08 DIAGNOSIS — F410 Panic disorder [episodic paroxysmal anxiety] without agoraphobia: Principal | ICD-10-CM

## 2024-10-08 DIAGNOSIS — Z13220 Encounter for screening for lipoid disorders: Principal | ICD-10-CM

## 2024-10-08 DIAGNOSIS — G4700 Insomnia, unspecified: Principal | ICD-10-CM

## 2024-10-22 ENCOUNTER — Encounter
Admit: 2024-10-22 | Discharge: 2024-10-22 | Payer: BLUE CROSS/BLUE SHIELD | Attending: Infectious Disease | Primary: Infectious Disease

## 2024-10-22 DIAGNOSIS — F410 Panic disorder [episodic paroxysmal anxiety] without agoraphobia: Principal | ICD-10-CM

## 2024-10-22 DIAGNOSIS — F17210 Nicotine dependence, cigarettes, uncomplicated: Principal | ICD-10-CM

## 2024-10-22 DIAGNOSIS — I4891 Unspecified atrial fibrillation: Principal | ICD-10-CM

## 2024-10-22 DIAGNOSIS — I10 Essential (primary) hypertension: Principal | ICD-10-CM

## 2024-10-22 DIAGNOSIS — Z1239 Encounter for other screening for malignant neoplasm of breast: Principal | ICD-10-CM

## 2024-10-22 DIAGNOSIS — E785 Hyperlipidemia, unspecified: Principal | ICD-10-CM

## 2024-10-22 DIAGNOSIS — I2510 Atherosclerotic heart disease of native coronary artery without angina pectoris: Principal | ICD-10-CM

## 2024-10-22 DIAGNOSIS — F32A Depression, unspecified depression type: Principal | ICD-10-CM

## 2024-10-22 DIAGNOSIS — B20 Human immunodeficiency virus [HIV] disease: Principal | ICD-10-CM

## 2024-10-22 DIAGNOSIS — F411 Generalized anxiety disorder: Principal | ICD-10-CM

## 2024-10-22 MED ORDER — ESCITALOPRAM 10 MG TABLET
ORAL_TABLET | Freq: Every day | ORAL | 3 refills | 90.00000 days
Start: 2024-10-22 — End: ?

## 2024-10-23 DIAGNOSIS — B20 Human immunodeficiency virus [HIV] disease: Principal | ICD-10-CM

## 2024-10-23 MED ORDER — ODEFSEY 200 MG-25 MG-25 MG TABLET
ORAL_TABLET | Freq: Every day | ORAL | 0 refills | 30.00000 days | Status: CP
Start: 2024-10-23 — End: ?

## 2024-10-23 MED ORDER — ESCITALOPRAM 10 MG TABLET
ORAL_TABLET | ORAL | 3 refills | 0.00000 days | Status: CP
Start: 2024-10-23 — End: ?

## 2024-11-01 DIAGNOSIS — F410 Panic disorder [episodic paroxysmal anxiety] without agoraphobia: Principal | ICD-10-CM

## 2024-11-01 DIAGNOSIS — F17210 Nicotine dependence, cigarettes, uncomplicated: Principal | ICD-10-CM

## 2024-11-01 DIAGNOSIS — G4700 Insomnia, unspecified: Principal | ICD-10-CM

## 2024-11-01 DIAGNOSIS — F411 Generalized anxiety disorder: Principal | ICD-10-CM

## 2024-11-13 ENCOUNTER — Encounter
Admit: 2024-11-13 | Discharge: 2024-11-13 | Payer: Medicaid (Managed Care) | Attending: Student in an Organized Health Care Education/Training Program | Primary: Student in an Organized Health Care Education/Training Program

## 2024-11-13 DIAGNOSIS — R600 Localized edema: Principal | ICD-10-CM

## 2024-11-13 DIAGNOSIS — E7849 Other hyperlipidemia: Principal | ICD-10-CM

## 2024-11-13 DIAGNOSIS — Z720 Tobacco use: Principal | ICD-10-CM

## 2024-11-13 DIAGNOSIS — I4891 Unspecified atrial fibrillation: Principal | ICD-10-CM

## 2024-11-13 DIAGNOSIS — R002 Palpitations: Principal | ICD-10-CM

## 2024-11-13 DIAGNOSIS — I10 Essential (primary) hypertension: Principal | ICD-10-CM

## 2024-11-13 DIAGNOSIS — I2510 Atherosclerotic heart disease of native coronary artery without angina pectoris: Principal | ICD-10-CM

## 2024-11-20 DIAGNOSIS — I10 Essential (primary) hypertension: Principal | ICD-10-CM

## 2024-11-20 MED ORDER — BENAZEPRIL 40 MG TABLET
ORAL_TABLET | Freq: Every day | ORAL | 3 refills | 0.00000 days
Start: 2024-11-20 — End: ?

## 2024-11-21 MED ORDER — BENAZEPRIL 40 MG TABLET
ORAL_TABLET | Freq: Every day | ORAL | 3 refills | 90.00000 days | Status: CP
Start: 2024-11-21 — End: ?

## 2024-11-24 ENCOUNTER — Emergency Department
Admit: 2024-11-24 | Discharge: 2024-11-24 | Disposition: A | Discharge status: Home / Self Care | Payer: BLUE CROSS/BLUE SHIELD | Attending: Family Medicine

## 2024-11-24 DIAGNOSIS — M25562 Pain in left knee: Principal | ICD-10-CM

## 2024-11-24 MED ORDER — OXYCODONE-ACETAMINOPHEN 5 MG-325 MG TABLET
ORAL_TABLET | ORAL | 0 refills | 2.00000 days | Status: CP | PRN
Start: 2024-11-24 — End: 2024-11-29

## 2024-12-03 DIAGNOSIS — B20 Human immunodeficiency virus [HIV] disease: Principal | ICD-10-CM

## 2024-12-03 MED ORDER — ODEFSEY 200 MG-25 MG-25 MG TABLET
ORAL_TABLET | Freq: Every day | ORAL | 11 refills | 0.00000 days
Start: 2024-12-03 — End: ?

## 2024-12-04 ENCOUNTER — Ambulatory Visit: Admit: 2024-12-04 | Payer: BLUE CROSS/BLUE SHIELD

## 2024-12-04 MED ORDER — ODEFSEY 200 MG-25 MG-25 MG TABLET
ORAL_TABLET | Freq: Every day | ORAL | 6 refills | 30.00000 days | Status: CP
Start: 2024-12-04 — End: ?
